# Patient Record
Sex: Female | Born: 1990 | Race: White | Hispanic: No | Marital: Married | Smoking: Never smoker
Health system: Southern US, Community
[De-identification: ages and names within clinical notes are randomized; demographics above are authoritative.]

## PROBLEM LIST (undated history)

## (undated) DIAGNOSIS — R519 Headache, unspecified: Secondary | ICD-10-CM

## (undated) DIAGNOSIS — R51 Headache: Secondary | ICD-10-CM

## (undated) HISTORY — PX: WISDOM TOOTH EXTRACTION: SHX21

---

## 2016-01-26 ENCOUNTER — Telehealth: Payer: Self-pay | Admitting: *Deleted

## 2016-01-26 ENCOUNTER — Encounter: Payer: Self-pay | Admitting: *Deleted

## 2016-01-26 ENCOUNTER — Ambulatory Visit
Admission: RE | Admit: 2016-01-26 | Discharge: 2016-01-26 | Disposition: A | Discharge status: Home / Self Care | Payer: 59 | Source: Ambulatory Visit | Attending: Family Medicine | Admitting: Family Medicine

## 2016-01-26 ENCOUNTER — Ambulatory Visit
Admission: EM | Admit: 2016-01-26 | Discharge: 2016-01-26 | Disposition: A | Discharge status: Home / Self Care | Payer: 59 | Attending: Family Medicine | Admitting: Family Medicine

## 2016-01-26 DIAGNOSIS — R1011 Right upper quadrant pain: Secondary | ICD-10-CM | POA: Diagnosis not present

## 2016-01-26 DIAGNOSIS — K8020 Calculus of gallbladder without cholecystitis without obstruction: Secondary | ICD-10-CM | POA: Diagnosis not present

## 2016-01-26 DIAGNOSIS — R932 Abnormal findings on diagnostic imaging of liver and biliary tract: Secondary | ICD-10-CM | POA: Insufficient documentation

## 2016-01-26 DIAGNOSIS — R112 Nausea with vomiting, unspecified: Secondary | ICD-10-CM

## 2016-01-26 DIAGNOSIS — Z711 Person with feared health complaint in whom no diagnosis is made: Secondary | ICD-10-CM

## 2016-01-26 LAB — COMPREHENSIVE METABOLIC PANEL
ALT: 23 U/L (ref 14–54)
ANION GAP: 9 (ref 5–15)
AST: 19 U/L (ref 15–41)
Albumin: 4.8 g/dL (ref 3.5–5.0)
Alkaline Phosphatase: 77 U/L (ref 38–126)
BILIRUBIN TOTAL: 0.5 mg/dL (ref 0.3–1.2)
BUN: 14 mg/dL (ref 6–20)
CALCIUM: 9.6 mg/dL (ref 8.9–10.3)
CO2: 24 mmol/L (ref 22–32)
Chloride: 105 mmol/L (ref 101–111)
Creatinine, Ser: 0.69 mg/dL (ref 0.44–1.00)
GLUCOSE: 108 mg/dL — AB (ref 65–99)
Potassium: 4.2 mmol/L (ref 3.5–5.1)
Sodium: 138 mmol/L (ref 135–145)
TOTAL PROTEIN: 8.4 g/dL — AB (ref 6.5–8.1)

## 2016-01-26 LAB — CBC WITH DIFFERENTIAL/PLATELET
Basophils Absolute: 0 10*3/uL (ref 0–0.1)
Basophils Relative: 0 %
Eosinophils Absolute: 0.1 10*3/uL (ref 0–0.7)
Eosinophils Relative: 1 %
HEMATOCRIT: 43.4 % (ref 35.0–47.0)
HEMOGLOBIN: 14.9 g/dL (ref 12.0–16.0)
LYMPHS ABS: 1.7 10*3/uL (ref 1.0–3.6)
Lymphocytes Relative: 18 %
MCH: 28.4 pg (ref 26.0–34.0)
MCHC: 34.3 g/dL (ref 32.0–36.0)
MCV: 82.8 fL (ref 80.0–100.0)
Monocytes Absolute: 0.4 10*3/uL (ref 0.2–0.9)
Monocytes Relative: 4 %
NEUTROS ABS: 6.9 10*3/uL — AB (ref 1.4–6.5)
NEUTROS PCT: 77 %
Platelets: 309 10*3/uL (ref 150–440)
RBC: 5.24 MIL/uL — ABNORMAL HIGH (ref 3.80–5.20)
RDW: 12.9 % (ref 11.5–14.5)
WBC: 9 10*3/uL (ref 3.6–11.0)

## 2016-01-26 LAB — URINALYSIS, COMPLETE (UACMP) WITH MICROSCOPIC
Bilirubin Urine: NEGATIVE
Glucose, UA: NEGATIVE mg/dL
KETONES UR: NEGATIVE mg/dL
LEUKOCYTES UA: NEGATIVE
NITRITE: NEGATIVE
PH: 5.5 (ref 5.0–8.0)
Protein, ur: NEGATIVE mg/dL

## 2016-01-26 LAB — PREGNANCY, URINE: Preg Test, Ur: NEGATIVE

## 2016-01-26 LAB — LIPASE, BLOOD: Lipase: 28 U/L (ref 11–51)

## 2016-01-26 LAB — AMYLASE: Amylase: 63 U/L (ref 28–100)

## 2016-01-26 MED ORDER — SUCRALFATE 1 G PO TABS: 2.0000 g | ORAL_TABLET | Freq: Two times a day (BID) | ORAL | 0 refills | Status: DC

## 2016-01-26 MED ORDER — ONDANSETRON 8 MG PO TBDP
8.0000 mg | ORAL_TABLET | Freq: Once | ORAL | Status: AC
  Administered 2016-01-26: 8 mg via ORAL

## 2016-01-26 MED ORDER — ONDANSETRON 8 MG PO TBDP: 8.0000 mg | ORAL_TABLET | Freq: Three times a day (TID) | ORAL | 0 refills | Status: DC | PRN

## 2016-01-26 NOTE — ED Provider Notes (Signed)
[facility]    CSN: [id number] Arrival date & time: 01/26/16  [id number]     History   Chief Complaint Chief Complaint  Patient presents with  . Abdominal Pain  . Emesis  . Nausea    HPI Chelsea Richardson is a 26 y.o. female.   Patient is a 26 year old white female with abdominal pain. She states that it woke her up about 3:30 this morning about 5:00 the pain seemed to move up to up her right quadrant. Initial presentation she is told the staff that she was having right lower quadrant pain is started more in the mid beta has since moved to the right upper quadrant. She reports she started throwing up soon after that and threw up several times. She did not eat anything since last night which she had a taco casserole. She reports feeling no appetite this morning because of the recurrent nausea. No fever or chills. She reports some urinary frequency but states that she drank a lot of water when she first woke up the pain. No previous abdominal surgeries no current medical problems. She is a gravida 1 para 0 she states last list. Was about 3 months ago. She states that she is not using any type of birth control and she gets pregnant that happens she's not tried to prevent pregnancy either and she is sexually active. Other than wisdom teeth extraction she's had no surgeries. She denied any chronic medications no known drug allergies. She does not smoke to stop tobacco. She states that there is no history of gallbladder disease in the family and mother corroborates that as well.   The history is provided by the patient and a parent. No language interpreter was used.  Abdominal Pain  Pain location:  RUQ Pain quality: cramping   Pain radiates to:  Does not radiate Pain severity:  Moderate Onset quality:  Sudden Duration:  9 hours Timing:  Constant Progression:  Waxing and waning Chronicity:  New Context: awakening from sleep   Context: not diet changes, not eating, not laxative use,  not medication withdrawal, not previous surgeries, not recent illness, not recent travel, not sick contacts and not suspicious food intake   Relieved by:  Nothing Worsened by:  Nothing Ineffective treatments:  None tried Associated symptoms: nausea and vomiting   Emesis  Associated symptoms: abdominal pain   Associated symptoms: no myalgias     History reviewed. No pertinent past medical history.  There are no active problems to display for this patient.   History reviewed. No pertinent surgical history.  OB History    No data available       Home Medications    Prior to Admission medications   Medication Sig Start Date End Date Taking? Authorizing Provider  ondansetron (ZOFRAN ODT) 8 MG disintegrating tablet Take 1 tablet (8 mg total) by mouth every 8 (eight) hours as needed for nausea or vomiting. 01/26/16   Hassan RowanEugene Anely Spiewak, MD  sucralfate (CARAFATE) 1 g tablet Take 2 tablets (2 g total) by mouth 2 (two) times daily. An hour before eating. 01/26/16   Hassan RowanEugene Nalea Salce, MD    Family History History reviewed. No pertinent family history.  Social History Social History  Substance Use Topics  . Smoking status: Never Smoker  . Smokeless tobacco: Never Used  . Alcohol use No     Allergies   Patient has no known allergies.   Review of Systems Review of Systems  Gastrointestinal: Positive for abdominal pain, nausea and vomiting.  Musculoskeletal: Negative for myalgias.  All other systems reviewed and are negative.    Physical Exam Triage Vital Signs ED Triage Vitals  Enc Vitals Group     BP 01/26/16 0841 125/80     Pulse Rate 01/26/16 0841 87     Resp 01/26/16 0841 16     Temp 01/26/16 0841 98.1 F (36.7 C)     Temp Source 01/26/16 0841 Oral     SpO2 01/26/16 0841 98 %     Weight 01/26/16 0843 200 lb (90.7 kg)     Height 01/26/16 0843 5\' 2"  (1.575 m)     Head Circumference --      Peak Flow --      Pain Score 01/26/16 0846 6     Pain Loc --      Pain Edu? --       Excl. in GC? --    No data found.   Updated Vital Signs BP 125/80 (BP Location: Left Arm)   Pulse 87   Temp 98.1 F (36.7 C) (Oral)   Resp 16   Ht 5\' 2"  (1.575 m)   Wt 200 lb (90.7 kg)   LMP 10/26/2015 (Approximate)   SpO2 98%   BMI 36.58 kg/m   Visual Acuity Right Eye Distance:   Left Eye Distance:   Bilateral Distance:    Right Eye Near:   Left Eye Near:    Bilateral Near:     Physical Exam  Constitutional: She is oriented to person, place, and time. She appears well-developed. No distress.  HENT:  Head: Normocephalic and atraumatic.  Right Ear: External ear normal.  Left Ear: External ear normal.  Eyes: EOM are normal. Pupils are equal, round, and reactive to light.  Neck: Normal range of motion. Neck supple.  Cardiovascular: Normal rate, regular rhythm and normal heart sounds.   Pulmonary/Chest: Effort normal and breath sounds normal.  Abdominal: Soft. Bowel sounds are normal. She exhibits distension. There is no hepatosplenomegaly. There is tenderness in the right upper quadrant. There is no CVA tenderness. No hernia.    Musculoskeletal: Normal range of motion.  Neurological: She is alert and oriented to person, place, and time.  Skin: Skin is warm. She is not diaphoretic.  Psychiatric: She has a normal mood and affect.  Vitals reviewed.    UC Treatments / Results  Labs (all labs ordered are listed, but only abnormal results are displayed) Labs Reviewed  URINALYSIS, COMPLETE (UACMP) WITH MICROSCOPIC - Abnormal; Notable for the following:       Result Value   Specific Gravity, Urine <1.005 (*)    Hgb urine dipstick SMALL (*)    Squamous Epithelial / LPF 0-5 (*)    Bacteria, UA RARE (*)    All other components within normal limits  CBC WITH DIFFERENTIAL/PLATELET - Abnormal; Notable for the following:    RBC 5.24 (*)    Neutro Abs 6.9 (*)    All other components within normal limits  COMPREHENSIVE METABOLIC PANEL - Abnormal; Notable for the following:     Glucose, Bld 108 (*)    Total Protein 8.4 (*)    All other components within normal limits  URINE CULTURE  PREGNANCY, URINE  AMYLASE  LIPASE, BLOOD    EKG  EKG Interpretation None       Radiology No results found.  Procedures Procedures (including critical care time)  Medications Ordered in UC Medications  ondansetron (ZOFRAN-ODT) disintegrating tablet 8 mg (8 mg Oral Given 01/26/16 0927)  Initial Impression / Assessment and Plan / UC Course  I have reviewed the triage vital signs and the nursing notes.  Pertinent labs & imaging results that were available during my care of the patient were reviewed by me and considered in my medical decision making (see chart for details).   Results for orders placed or performed during the hospital encounter of 01/26/16  Urinalysis, Complete w Microscopic  Result Value Ref Range   Color, Urine YELLOW YELLOW   APPearance CLEAR CLEAR   Specific Gravity, Urine <1.005 (L) 1.005 - 1.030   pH 5.5 5.0 - 8.0   Glucose, UA NEGATIVE NEGATIVE mg/dL   Hgb urine dipstick SMALL (A) NEGATIVE   Bilirubin Urine NEGATIVE NEGATIVE   Ketones, ur NEGATIVE NEGATIVE mg/dL   Protein, ur NEGATIVE NEGATIVE mg/dL   Nitrite NEGATIVE NEGATIVE   Leukocytes, UA NEGATIVE NEGATIVE   Squamous Epithelial / LPF 0-5 (A) NONE SEEN   WBC, UA 0-5 0 - 5 WBC/hpf   RBC / HPF 0-5 0 - 5 RBC/hpf   Bacteria, UA RARE (A) NONE SEEN  Pregnancy, urine  Result Value Ref Range   Preg Test, Ur NEGATIVE NEGATIVE  Amylase  Result Value Ref Range   Amylase 63 28 - 100 U/L  Lipase, blood  Result Value Ref Range   Lipase 28 11 - 51 U/L  CBC with Differential  Result Value Ref Range   WBC 9.0 3.6 - 11.0 K/uL   RBC 5.24 (H) 3.80 - 5.20 MIL/uL   Hemoglobin 14.9 12.0 - 16.0 g/dL   HCT 16.1 09.6 - 04.5 %   MCV 82.8 80.0 - 100.0 fL   MCH 28.4 26.0 - 34.0 pg   MCHC 34.3 32.0 - 36.0 g/dL   RDW 40.9 81.1 - 91.4 %   Platelets 309 150 - 440 K/uL   Neutrophils Relative % 77  %   Neutro Abs 6.9 (H) 1.4 - 6.5 K/uL   Lymphocytes Relative 18 %   Lymphs Abs 1.7 1.0 - 3.6 K/uL   Monocytes Relative 4 %   Monocytes Absolute 0.4 0.2 - 0.9 K/uL   Eosinophils Relative 1 %   Eosinophils Absolute 0.1 0 - 0.7 K/uL   Basophils Relative 0 %   Basophils Absolute 0.0 0 - 0.1 K/uL  Comprehensive metabolic panel  Result Value Ref Range   Sodium 138 135 - 145 mmol/L   Potassium 4.2 3.5 - 5.1 mmol/L   Chloride 105 101 - 111 mmol/L   CO2 24 22 - 32 mmol/L   Glucose, Bld 108 (H) 65 - 99 mg/dL   BUN 14 6 - 20 mg/dL   Creatinine, Ser 7.82 0.44 - 1.00 mg/dL   Calcium 9.6 8.9 - 95.6 mg/dL   Total Protein 8.4 (H) 6.5 - 8.1 g/dL   Albumin 4.8 3.5 - 5.0 g/dL   AST 19 15 - 41 U/L   ALT 23 14 - 54 U/L   Alkaline Phosphatase 77 38 - 126 U/L   Total Bilirubin 0.5 0.3 - 1.2 mg/dL   GFR calc non Af Amer >60 >60 mL/min   GFR calc Af Amer >60 >60 mL/min   Anion gap 9 5 - 15   Clinical Course       Patient had improvement with Zofran for nausea we'll place her on Zofran to go home and also Carafate 2 tablets twice a day an hour before eating. Should be noted lab work were reviewed urinalysis was unremarkable white count is normal amylase and  lipase was normal and CMP was essentially normal as well of the blood sugar I think of 109. Because the marked tenderness in the right upper quadrant I've offered to order an ultrasound of the gallbladder which they have elected to do. We'll place on Carafate 2 tablets twice a day and will order ultrasound with her gallstones. Present and a work note for today and tomorrow. Expect to her and her mother that if she continues to have pain i and her ultrasound is negative she needs to see her PCP for further testing for the evaluation. Explained to him not eating anything until the ultrasound was done.    Final Clinical Impressions(s) / UC Diagnoses   Final diagnoses:  RUQ abdominal pain  Non-intractable vomiting with nausea, unspecified vomiting  type  Concern about gallstones without diagnosis    New Prescriptions Discharge Medication List as of 01/26/2016 10:17 AM    START taking these medications   Details  ondansetron (ZOFRAN ODT) 8 MG disintegrating tablet Take 1 tablet (8 mg total) by mouth every 8 (eight) hours as needed for nausea or vomiting., Starting Wed 01/26/2016, Normal    sucralfate (CARAFATE) 1 g tablet Take 2 tablets (2 g total) by mouth 2 (two) times daily. An hour before eating., Starting Wed 01/26/2016, Normal        Note: This dictation was prepared with Dragon dictation along with smaller phrase technology. Any transcriptional errors that result from this process are unintentional.   Hassan Rowan, MD 01/26/16 1046

## 2016-01-26 NOTE — ED Notes (Signed)
Per Dr Thurmond ButtsWade, [facility] she requested first available appointment. I contacted [facility] and spoke with Whitney with pt info, they will contact pt to arrange appointment.

## 2016-01-26 NOTE — ED Triage Notes (Signed)
Pt awoke with RLQ abd pain this morning approx 0300, also N/V x2.

## 2016-01-27 ENCOUNTER — Encounter: Payer: Self-pay | Admitting: Surgery

## 2016-01-27 ENCOUNTER — Ambulatory Visit (INDEPENDENT_AMBULATORY_CARE_PROVIDER_SITE_OTHER): Payer: 59 | Admitting: Surgery

## 2016-01-27 VITALS — BP 101/67 | HR 69 | Temp 97.9°F | Ht 62.0 in | Wt 219.0 lb

## 2016-01-27 DIAGNOSIS — K8020 Calculus of gallbladder without cholecystitis without obstruction: Secondary | ICD-10-CM | POA: Diagnosis not present

## 2016-01-27 NOTE — Patient Instructions (Addendum)
Please give [place] a call if you have any questions or concerns.   Low-Fat Diet for Gallbladder Conditions A low-fat diet can be helpful if you have pancreatitis or a gallbladder condition. With these conditions, your pancreas and gallbladder have trouble digesting fats. A healthy eating plan with less fat will help rest your pancreas and gallbladder and reduce your symptoms. What do I need to know about this diet?  Eat a low-fat diet.  Reduce your fat intake to less than 20-30% of your total daily calories. This is less than 50-60 g of fat per day.  Remember that you need some fat in your diet. Ask your dietician what your daily goal should be.  Choose nonfat and low-fat healthy foods. Look for the words "nonfat," "low fat," or "fat free."  As a guide, look on the label and choose foods with less than 3 g of fat per serving. Eat only one serving.  Avoid alcohol.  Do not smoke. If you need help quitting, talk with your health care provider.  Eat small frequent meals instead of three large heavy meals. What foods can I eat? Grains  Include healthy grains and starches such as potatoes, wheat bread, fiber-rich cereal, and brown rice. Choose whole grain options whenever possible. In adults, whole grains should account for 45-65% of your daily calories. Fruits and Vegetables  Eat plenty of fruits and vegetables. Fresh fruits and vegetables add fiber to your diet. Meats and Other Protein Sources  Eat lean meat such as chicken and pork. Trim any fat off of meat before cooking it. Eggs, fish, and beans are other sources of protein. In adults, these foods should account for 10-35% of your daily calories. Dairy  Choose low-fat milk and dairy options. Dairy includes fat and protein, as well as calcium. Fats and Oils  Limit high-fat foods such as fried foods, sweets, baked goods, sugary drinks. Other  Creamy sauces and condiments, such as mayonnaise, can add extra fat. Think about whether or not  you need to use them, or use smaller amounts or low fat options. What foods are not recommended?  High fat foods, such as:  [employer].  Ice cream.  [place] toast.  Sweet rolls.  Pizza.  Cheese bread.  Foods covered with batter, butter, creamy sauces, or cheese.  Fried foods.  Sugary drinks and desserts.  Foods that cause gas or bloating This information is not intended to replace advice given to you by your health care provider. Make sure you discuss any questions you have with your health care provider. Document Released: 01/07/2013 Document Revised: 06/10/2015 Document Reviewed: 12/16/2012 Elsevier Interactive Patient Education  2017 [employer]    Laparoscopic Cholecystectomy Laparoscopic cholecystectomy is surgery to remove the gallbladder. The gallbladder is a pear-shaped organ that lies beneath the liver on the right side of the body. The gallbladder stores bile, which is a fluid that helps the body to digest fats. Cholecystectomy is often done for inflammation of the gallbladder (cholecystitis). This condition is usually caused by a buildup of gallstones (cholelithiasis) in the gallbladder. Gallstones can block the flow of bile, which can result in inflammation and pain. In severe cases, emergency surgery may be required. This procedure is done though small incisions in your abdomen (laparoscopic surgery). A thin scope with a camera (laparoscope) is inserted through one incision. Thin surgical instruments are inserted through the other incisions. In some cases, a laparoscopic procedure may be turned into a type of surgery that is done through a  larger incision (open surgery). Tell a health care provider about:  Any allergies you have.  All medicines you are taking, including vitamins, herbs, eye drops, creams, and over-the-counter medicines.  Any problems you or family members have had with anesthetic medicines.  Any blood disorders you have.  Any surgeries you  have had.  Any medical conditions you have.  Whether you are pregnant or may be pregnant. What are the risks? Generally, this is a safe procedure. However, problems may occur, including:  Infection.  Bleeding.  Allergic reactions to medicines.  Damage to other structures or organs.  A stone remaining in the common bile duct. The common bile duct carries bile from the gallbladder into the small intestine.  A bile leak from the cyst duct that is clipped when your gallbladder is removed. What happens before the procedure? Staying hydrated  Follow instructions from your health care provider about hydration, which may include:  Up to 2 hours before the procedure - you may continue to drink clear liquids, such as water, clear fruit juice, black coffee, and plain tea. Eating and drinking restrictions  Follow instructions from your health care provider about eating and drinking, which may include:  8 hours before the procedure - stop eating heavy meals or foods such as meat, fried foods, or fatty foods.  6 hours before the procedure - stop eating light meals or foods, such as toast or cereal.  6 hours before the procedure - stop drinking milk or drinks that contain milk.  2 hours before the procedure - stop drinking clear liquids. Medicines  Ask your health care provider about:  Changing or stopping your regular medicines. This is especially important if you are taking diabetes medicines or blood thinners.  Taking medicines such as aspirin and ibuprofen. These medicines can thin your blood. Do not take these medicines before your procedure if your health care provider instructs you not to.  You may be given antibiotic medicine to help prevent infection. General instructions  Let your health care provider know if you develop a cold or an infection before surgery.  Plan to have someone take you home from the hospital or clinic.  Ask your health care provider how your surgical  site will be marked or identified. What happens during the procedure?  To reduce your risk of infection:  Your health care team will wash or sanitize their hands.  Your skin will be washed with soap.  Hair may be removed from the surgical area.  An IV tube may be inserted into one of your veins.  You will be given one or more of the following:  A medicine to help you relax (sedative).  A medicine to make you fall asleep (general anesthetic).  A breathing tube will be placed in your mouth.  Your surgeon will make several small cuts (incisions) in your abdomen.  The laparoscope will be inserted through one of the small incisions. The camera on the laparoscope will send images to a TV screen (monitor) in the operating room. This lets your surgeon see inside your abdomen.  Air-like gas will be pumped into your abdomen. This will expand your abdomen to give the surgeon more room to perform the surgery.  Other tools that are needed for the procedure will be inserted through the other incisions. The gallbladder will be removed through one of the incisions.  Your common bile duct may be examined. If stones are found in the common bile duct, they may be removed.  After your gallbladder has been removed, the incisions will be closed with stitches (sutures), staples, or skin glue.  Your incisions may be covered with a bandage (dressing). The procedure may vary among health care providers and hospitals. What happens after the procedure?  Your blood pressure, heart rate, breathing rate, and blood oxygen level will be monitored until the medicines you were given have worn off.  You will be given medicines as needed to control your pain.  Do not drive for 24 hours if you were given a sedative. This information is not intended to replace advice given to you by your health care provider. Make sure you discuss any questions you have with your health care provider. Document Released:  01/02/2005 Document Revised: 07/25/2015 Document Reviewed: 06/21/2015 Elsevier Interactive Patient Education  2017 [employer].

## 2016-01-27 NOTE — Progress Notes (Signed)
01/27/2016  Reason for Visit:  Cholelithiasis  History of Present Illness: Chelsea Richardson is a 26 y.o. female who presented to urgent care yesterday at 3 AM with an episode of biliary colic. She had had a tackle casserole for dinner the night prior. She reports she was having episodes of nausea and vomiting 3 as well as right upper quadrant pain. When she went to urgent care facility she was given medication for nausea and her pain subsided on its own. Her workup at the urgent care facility included labs and ultrasound. Her LFTs were normal and her white blood cell count was normal. Her ultrasound showed cholelithiasis with the largest stone measuring 1.3 cm but otherwise no gallbladder wall thickening or pericholecystic fluid.  She reports that her pain subsided while she was in the emergency room and she only had some mild lingering discomfort in the right upper quadrant. Today she denies having any significant pain. She also denies having any fevers, chills, chest pain, shortness of breath, nausea, vomiting, any other abdominal pain.  Of note, this is her first episode of biliary colic and has never had any similar pain in the past.  Past Medical History: No past medical history on file.   Past Surgical History: Past Surgical History:  Procedure Laterality Date  . WISDOM TOOTH EXTRACTION      Home Medications: Prior to Admission medications   Medication Sig Start Date End Date Taking? Authorizing Provider  ondansetron (ZOFRAN ODT) 8 MG disintegrating tablet Take 1 tablet (8 mg total) by mouth every 8 (eight) hours as needed for nausea or vomiting. 01/26/16  Yes Hassan RowanEugene Wade, MD  sucralfate (CARAFATE) 1 g tablet Take 2 tablets (2 g total) by mouth 2 (two) times daily. An hour before eating. 01/26/16  Yes Hassan RowanEugene Wade, MD    Allergies: No Known Allergies  Social History:  reports that she has never smoked. She has never used smokeless tobacco. She reports that she does not drink alcohol  or use drugs.   Family History: Family History  Problem Relation Age of Onset  . Healthy Mother   . Healthy Father   . Cancer Maternal Grandmother [phone]    breast    Review of Systems: Review of Systems  Constitutional: Negative for chills and fever.  HENT: Negative for hearing loss.   Eyes: Negative for blurred vision.  Respiratory: Negative for cough and shortness of breath.   Cardiovascular: Negative for chest pain and leg swelling.  Gastrointestinal: Positive for abdominal pain, nausea and vomiting. Negative for constipation, diarrhea and heartburn.  Genitourinary: Negative for dysuria and hematuria.  Musculoskeletal: Negative for myalgias.  Neurological: Negative for dizziness.  Psychiatric/Behavioral: Negative for depression.  All other systems reviewed and are negative.   Physical Exam BP 101/67   Pulse 69   Temp 97.9 F (36.6 C) (Oral)   Ht 5\' 2"  (1.575 m)   Wt 99.3 kg (219 lb)   LMP 10/26/2015 (Approximate)   BMI 40.06 kg/m  CONSTITUTIONAL: No acute distress, appears comfortable. HEENT:  Normocephalic, atraumatic, extraocular motion intact. NECK: Trachea is midline, and there is no jugular venous distension.  RESPIRATORY:  Lungs are clear, and breath sounds are equal bilaterally. Normal respiratory effort without pathologic use of accessory muscles. CARDIOVASCULAR: Heart is regular without murmurs, gallops, or rubs. GI: The abdomen is soft, nondistended, obese, with minimal discomfort over the right upper quadrant. Negative Murphy's sign. There were no palpable masses.  MUSCULOSKELETAL:  Normal muscle strength and tone in all four extremities.  No peripheral edema or cyanosis. SKIN: Skin turgor is normal. There are no pathologic skin lesions.  NEUROLOGIC:  Motor and sensation is grossly normal.  Cranial nerves are grossly intact. PSYCH:  Alert and oriented to person, place and time. Affect is normal.  Laboratory Analysis: No results found for this or any previous  visit (from the past 24 hour(s)).  Imaging: US Abdomen Limited Ruq  Result Date: 01/26/2016 CLINICAL DATA:  9 hours of right upper quadrant abdominal pain associated with nausea and vomiting. EXAM: US ABDOMEN LIMITED - RIGHT UPPER QUADRANT COMPARISON:  None in PACs FINDINGS: Gallbladder: The gallbladder is adequately distended. There are stones present with the largest measuring 1.3 cm. There is no gallbladder wall thickening, pericholecystic fluid, or positive sonographic Murphy's sign. Common bile duct: Diameter: 3.2 mm Liver: The hepatic echotexture is mildly increased diffusely. There is no focal mass nor ductal dilation. IMPRESSION: Gallstones without sonographic evidence of acute cholecystitis. Increased hepatic echotexture likely reflects fatty infiltrative change. These results will be called to the ordering clinician or representative by the Radiologist Assistant, and communication documented in the PACS or zVision Dashboard. Electronically Signed   By: David  [place] M.D.   On: 01/26/2016 12:19    Assessment and Plan: This is a 26 y.o. female who presents with her first episode of biliary colic. I personally reviewed all of the patient's laboratory studies and imaging studies.  -Had a lengthy discussion with the patient that currently there is no urgency or emergency to have her gallbladder removed. There is no evidence of cholecystitis and all of her labs were normal yesterday and her pain has subsided appropriately.  -Discussed with the patient the options of watchful waiting with some dietary modifications like low-fat diet to decrease any risks of further biliary colic episodes, did inform the patient that even with these changes there is no guarantee that she would not have any further episodes and if she does it would be difficult to say when they would happen. The patient does understand this but she would rather not have any surgery unless is actually needed.  -With that in mind I did  discuss with the patient any further signs or symptoms to look out for including further nausea, vomiting, worsening right upper quadrant pain that does not subside on its own, jaundice of the skin or scleral icterus, and fevers or chills. If any of these were to happen she should return to urgent care or emergency room for further evaluation. I also discussed with the patient that if she were to change her mind and seek surgical management for her biliary colic that we would be happy to see her in the future.   Howie Ill, MD [facility]

## 2016-01-28 LAB — URINE CULTURE: SPECIAL REQUESTS: NORMAL

## 2016-06-01 ENCOUNTER — Ambulatory Visit (INDEPENDENT_AMBULATORY_CARE_PROVIDER_SITE_OTHER): Payer: 59 | Admitting: Surgery

## 2016-06-01 ENCOUNTER — Encounter: Payer: Self-pay | Admitting: Surgery

## 2016-06-01 VITALS — BP 138/86 | HR 99 | Temp 98.1°F | Ht 62.0 in | Wt 200.0 lb

## 2016-06-01 DIAGNOSIS — K8020 Calculus of gallbladder without cholecystitis without obstruction: Secondary | ICD-10-CM | POA: Diagnosis not present

## 2016-06-01 NOTE — Progress Notes (Signed)
Surgical Consultation  06/01/2016  Chelsea Richardson is an 26 y.o. female.   Chief Complaint  Patient presents with  . Other    To disscuss gallstones & surgery-Previosly seen by Piscoya on 01/27/16     HPI: Chelsea Richardson is a 26 year old female well known to our service with a prior history of symptomatic cholelithiasis. She was seen back in January by Dr. Aleen Campi at that time she was very hesitant about having any surgical intervention. Now she comes back with a recurrent episode that is started 2 days ago with abdominal pain nausea and vomiting. Now her symptoms are better but she still has some sacral tenderness in the right upper quadrant. States that she has intermittent and sharp pain in the right upper quadrant sometimes severe and last for a few minutes. She has acid nausea and vomiting. No evidence of jaundice or cholangitis. She is taking some by mouth but has decreased appetite. Previous ultrasound personally reviewed there is evidence of cholelithiasis. Normal common bile duct and normal LFTs.  She is able to perform more than 4 Mets of activity without any shortness of breath or chest pain and no previous abdominal operations  History reviewed. No pertinent past medical history.  Past Surgical History:  Procedure Laterality Date  . WISDOM TOOTH EXTRACTION      Family History  Problem Relation Age of Onset  . Healthy Mother   . Healthy Father   . Cancer Maternal Grandmother 28       breast    Social History:  reports that she has never smoked. She has never used smokeless tobacco. She reports that she does not drink alcohol or use drugs.  Allergies: No Known Allergies  Medications reviewed.    ROS Full ROS performed and is otherwise negative other than what is stated in the HPI    BP 138/86   Pulse 99   Temp 98.1 F (36.7 C) (Oral)   Ht 5\' 2"  (1.575 m)   Wt 90.7 kg (200 lb)   BMI 36.58 kg/m   Physical Exam  Constitutional: She is oriented to person, place,  and time and well-developed, well-nourished, and in no distress. No distress.  HENT:  Head: Normocephalic.  Eyes: Right eye exhibits no discharge. Left eye exhibits no discharge. No scleral icterus.  Neck: Neck supple. JVD present. No tracheal deviation present. No thyromegaly present.  Cardiovascular: Normal rate and normal heart sounds.   No murmur heard. Pulmonary/Chest: Effort normal and breath sounds normal. No respiratory distress. She has no wheezes. She has no rales.  Abdominal: Soft. She exhibits no distension and no mass. There is tenderness. There is no rebound and no guarding.  RUQ tenderness, no peritonitis, neg murphy.  Musculoskeletal: Normal range of motion. She exhibits no edema.  Neurological: She is alert and oriented to person, place, and time. Gait normal. GCS score is 15.  Skin: Skin is warm and dry. She is not diaphoretic.  Psychiatric: Mood, memory, affect and judgment normal.  Nursing note and vitals reviewed.    Assessment/Plan: Symptomatic cholelithiasis with recurrent dramatic episodes. Discussed with the patient detail I strongly recommend laparoscopic cholecystectomy. Discussed with her and I do think that we need to do this sooner rather than later. I gave her the option of waiting 2 more weeks versus having Dr. Marcene Duos do her laparoscopic cholecystectomy in 3 days. She wishes to go ahead and have her gallbladder removed this coming Monday. I discussed the procedure in detail.  The patient was given  Agricultural engineereducational material.  We discussed the risks and benefits of a laparoscopic cholecystectomy and possible cholangiogram including, but not limited to bleeding, infection, injury to surrounding structures such as the intestine or liver, bile leak, retained gallstones, need to convert to an open procedure, prolonged diarrhea, blood clots such as  DVT, common bile duct injury, anesthesia risks, and possible need for additional procedures.  The likelihood of  improvement in symptoms and return to the patient's normal status is good. We discussed the typical post-operative recovery course.   Sterling Bigiego Yann Biehn, MD [facility] General Surgeon

## 2016-06-01 NOTE — Patient Instructions (Signed)
We have scheduled your surgery for the date listed below.   Please see your blue sheet for further instructions.

## 2016-06-02 ENCOUNTER — Telehealth: Payer: Self-pay | Admitting: Surgery

## 2016-06-02 ENCOUNTER — Encounter
Admission: RE | Admit: 2016-06-02 | Discharge: 2016-06-02 | Disposition: A | Discharge status: Home / Self Care | Payer: 59 | Source: Ambulatory Visit | Attending: Surgery | Admitting: Surgery

## 2016-06-02 HISTORY — DX: Headache: R51

## 2016-06-02 HISTORY — DX: Headache, unspecified: R51.9

## 2016-06-02 NOTE — Telephone Encounter (Signed)
Pt advised of pre op date/time and sx date. Sx: 06/05/16 with Dr Dellis FilbertPiscoya--Laparoscopic cholecystectomy.  Pre op: 06/02/16 (phone) between 9-1:00pm.  Patient made aware to call [phone], between 1-3:00pm the day before surgery, to find out what time to arrive.     Patient has agreed to make a payment for surgery for physician charges.   Deductible: 550.00 Co insurance: 15% Physician estimate: [phone]

## 2016-06-02 NOTE — Telephone Encounter (Signed)
CPT: [id number] has been approved for DOS: 06/05/16 with Dr Aleen CampiPiscoya.   [employer] approved online: Authorization number: [id number]

## 2016-06-02 NOTE — Patient Instructions (Signed)
  Your procedure is scheduled on: 06-05-16  Report to Same Day Surgery 2nd floor medical mall [facility](Medical Mall Entrance-take elevator on left to 2nd floor.  Check in with surgery information desk.) To find out your arrival time please call [phone] between 1PM - 3PM on 06-04-16  Remember: Instructions that are not followed completely may result in serious medical risk, up to and including death, or upon the discretion of your surgeon and anesthesiologist your surgery may need to be rescheduled.    _x___ 1. Do not eat food or drink liquids after midnight. No gum chewing or hard candies.     __x__ 2. No Alcohol for 24 hours before or after surgery.   __x__3. No Smoking for 24 prior to surgery.   ____  4. Bring all medications with you on the day of surgery if instructed.    __x__ 5. Notify your doctor if there is any change in your medical condition     (cold, fever, infections).     Do not wear jewelry, make-up, hairpins, clips or nail polish.  Do not wear lotions, powders, or perfumes. You may wear deodorant.  Do not shave 48 hours prior to surgery. Men may shave face and neck.  Do not bring valuables to the hospital.    [facility] any belongings or valuables.               Contacts, dentures or bridgework may not be worn into surgery.  Leave your suitcase in the car. After surgery it may be brought to your room.  For patients admitted to the hospital, discharge time is determined by your treatment team.   Patients discharged the day of surgery will not be allowed to drive home.  You will need someone to drive you home and stay with you the night of your procedure.    Please read over the following fact sheets that you were given:     ____ Take anti-hypertensive (unless it includes a diuretic), cardiac, seizure, asthma,     anti-reflux and psychiatric medicines. These include:  1. NONE  2.  3.  4.  5.  6.  ____Fleets enema or Magnesium Citrate as  directed.   _x___ Use CHG Soap or sage wipes as directed on instruction sheet   ____ Use inhalers on the day of surgery and bring to hospital day of surgery  ____ Stop Metformin and Janumet 2 days prior to surgery.    ____ Take 1/2 of usual insulin dose the night before surgery and none on the morning surgery.   ____ Follow recommendations from Cardiologist, Pulmonologist or PCP regarding stopping Aspirin, Coumadin, Pllavix ,Eliquis, Effient, or Pradaxa, and Pletal.  X____Stop Anti-inflammatories such as Advil, Aleve, IBUPROFEN, Motrin, Naproxen, Naprosyn, Goodies powders or aspirin products NOW0 OK to take Tylenol    ____ Stop supplements until after surgery   ____ Bring C-Pap to the hospital.

## 2016-06-04 MED ORDER — CEFAZOLIN SODIUM-DEXTROSE 2-4 GM/100ML-% IV SOLN
2.0000 g | INTRAVENOUS | Status: AC
  Administered 2016-06-05: 2 g via INTRAVENOUS

## 2016-06-05 ENCOUNTER — Observation Stay
Admission: RE | Admit: 2016-06-05 | Discharge: 2016-06-06 | Disposition: A | Discharge status: Home / Self Care | Payer: 59 | Source: Ambulatory Visit | Attending: Surgery | Admitting: Surgery

## 2016-06-05 ENCOUNTER — Encounter: Payer: Self-pay | Admitting: *Deleted

## 2016-06-05 ENCOUNTER — Ambulatory Visit: Payer: 59 | Admitting: *Deleted

## 2016-06-05 ENCOUNTER — Encounter
Admission: RE | Disposition: A | Discharge status: Home / Self Care | Payer: Self-pay | Source: Ambulatory Visit | Attending: Surgery

## 2016-06-05 DIAGNOSIS — K811 Chronic cholecystitis: Secondary | ICD-10-CM | POA: Diagnosis not present

## 2016-06-05 DIAGNOSIS — Z6838 Body mass index (BMI) 38.0-38.9, adult: Secondary | ICD-10-CM | POA: Insufficient documentation

## 2016-06-05 DIAGNOSIS — K8020 Calculus of gallbladder without cholecystitis without obstruction: Secondary | ICD-10-CM | POA: Diagnosis present

## 2016-06-05 DIAGNOSIS — E669 Obesity, unspecified: Secondary | ICD-10-CM | POA: Insufficient documentation

## 2016-06-05 DIAGNOSIS — K660 Peritoneal adhesions (postprocedural) (postinfection): Secondary | ICD-10-CM | POA: Insufficient documentation

## 2016-06-05 DIAGNOSIS — K8012 Calculus of gallbladder with acute and chronic cholecystitis without obstruction: Secondary | ICD-10-CM | POA: Diagnosis not present

## 2016-06-05 HISTORY — PX: CHOLECYSTECTOMY: SHX55

## 2016-06-05 LAB — CREATININE, SERUM
Creatinine, Ser: 0.73 mg/dL (ref 0.44–1.00)
GFR calc Af Amer: >60 mL/min (ref >60)
GFR calc non Af Amer: >60 mL/min (ref >60)

## 2016-06-05 LAB — POCT PREGNANCY, URINE: Preg Test, Ur: NEGATIVE

## 2016-06-05 SURGERY — LAPAROSCOPIC CHOLECYSTECTOMY
Anesthesia: General | Wound class: Clean Contaminated

## 2016-06-05 MED ORDER — ROCURONIUM BROMIDE 50 MG/5ML IV SOLN
INTRAVENOUS | Status: AC
  Filled 2016-06-05: qty 1

## 2016-06-05 MED ORDER — FENTANYL CITRATE (PF) 100 MCG/2ML IJ SOLN
INTRAMUSCULAR | Status: AC
  Filled 2016-06-05: qty 2

## 2016-06-05 MED ORDER — OXYCODONE HCL 5 MG PO TABS
5.0000 mg | ORAL_TABLET | ORAL | Status: DC | PRN
  Administered 2016-06-05: 10 mg via ORAL
  Administered 2016-06-05: 5 mg via ORAL
  Administered 2016-06-06 (×3): 10 mg via ORAL
  Filled 2016-06-05: qty 1
  Filled 2016-06-05 (×4): qty 2

## 2016-06-05 MED ORDER — FAMOTIDINE 20 MG PO TABS
ORAL_TABLET | ORAL | Status: AC
  Filled 2016-06-05: qty 1

## 2016-06-05 MED ORDER — HYDROMORPHONE HCL 1 MG/ML IJ SOLN: 0.5000 mg | INTRAMUSCULAR | Status: DC | PRN

## 2016-06-05 MED ORDER — DEXAMETHASONE SODIUM PHOSPHATE 10 MG/ML IJ SOLN
INTRAMUSCULAR | Status: DC | PRN
  Administered 2016-06-05: 10 mg via INTRAVENOUS

## 2016-06-05 MED ORDER — BUPIVACAINE-EPINEPHRINE (PF) 0.25% -1:200000 IJ SOLN
INTRAMUSCULAR | Status: AC
  Filled 2016-06-05: qty 30

## 2016-06-05 MED ORDER — POLYETHYLENE GLYCOL 3350 17 G PO PACK: 17.0000 g | PACK | Freq: Every day | ORAL | Status: DC | PRN

## 2016-06-05 MED ORDER — ONDANSETRON HCL 4 MG/2ML IJ SOLN
4.0000 mg | Freq: Once | INTRAMUSCULAR | Status: DC | PRN
Start: 2016-06-05 — End: 2016-06-05

## 2016-06-05 MED ORDER — MIDAZOLAM HCL 2 MG/2ML IJ SOLN
INTRAMUSCULAR | Status: AC
  Filled 2016-06-05: qty 2

## 2016-06-05 MED ORDER — ESMOLOL HCL 100 MG/10ML IV SOLN
INTRAVENOUS | Status: AC
  Filled 2016-06-05: qty 10

## 2016-06-05 MED ORDER — ONDANSETRON HCL 4 MG/2ML IJ SOLN
INTRAMUSCULAR | Status: DC | PRN
  Administered 2016-06-05: 4 mg via INTRAVENOUS

## 2016-06-05 MED ORDER — ONDANSETRON HCL 4 MG/2ML IJ SOLN: 4.0000 mg | Freq: Four times a day (QID) | INTRAMUSCULAR | Status: DC | PRN

## 2016-06-05 MED ORDER — EPHEDRINE SULFATE 50 MG/ML IJ SOLN
INTRAMUSCULAR | Status: DC | PRN
  Administered 2016-06-05: 10 mg via INTRAVENOUS

## 2016-06-05 MED ORDER — ACETAMINOPHEN 10 MG/ML IV SOLN
INTRAVENOUS | Status: DC | PRN
  Administered 2016-06-05: 1000 mg via INTRAVENOUS

## 2016-06-05 MED ORDER — EPHEDRINE SULFATE 50 MG/ML IJ SOLN
INTRAMUSCULAR | Status: AC
  Filled 2016-06-05: qty 1

## 2016-06-05 MED ORDER — BUPIVACAINE-EPINEPHRINE (PF) 0.25% -1:200000 IJ SOLN
INTRAMUSCULAR | Status: DC | PRN
  Administered 2016-06-05: 20 mL via PERINEURAL

## 2016-06-05 MED ORDER — FENTANYL CITRATE (PF) 100 MCG/2ML IJ SOLN
INTRAMUSCULAR | Status: DC | PRN
  Administered 2016-06-05 (×3): 100 ug via INTRAVENOUS

## 2016-06-05 MED ORDER — ROCURONIUM BROMIDE 100 MG/10ML IV SOLN
INTRAVENOUS | Status: DC | PRN
  Administered 2016-06-05: 10 mg via INTRAVENOUS
  Administered 2016-06-05: 40 mg via INTRAVENOUS
  Administered 2016-06-05: 20 mg via INTRAVENOUS

## 2016-06-05 MED ORDER — MIDAZOLAM HCL 2 MG/2ML IJ SOLN
INTRAMUSCULAR | Status: DC | PRN
  Administered 2016-06-05: 2 mg via INTRAVENOUS

## 2016-06-05 MED ORDER — PROPOFOL 10 MG/ML IV BOLUS
INTRAVENOUS | Status: DC | PRN
  Administered 2016-06-05: 150 mg via INTRAVENOUS

## 2016-06-05 MED ORDER — LIDOCAINE HCL (PF) 2 % IJ SOLN
INTRAMUSCULAR | Status: AC
  Filled 2016-06-05: qty 2

## 2016-06-05 MED ORDER — SUCCINYLCHOLINE CHLORIDE 20 MG/ML IJ SOLN
INTRAMUSCULAR | Status: DC | PRN
  Administered 2016-06-05: 120 mg via INTRAVENOUS

## 2016-06-05 MED ORDER — SUCCINYLCHOLINE CHLORIDE 20 MG/ML IJ SOLN
INTRAMUSCULAR | Status: AC
  Filled 2016-06-05: qty 1

## 2016-06-05 MED ORDER — ACETAMINOPHEN 500 MG PO TABS
1000.0000 mg | ORAL_TABLET | Freq: Four times a day (QID) | ORAL | Status: DC
  Administered 2016-06-05 – 2016-06-06 (×4): 1000 mg via ORAL
  Filled 2016-06-05 (×4): qty 2

## 2016-06-05 MED ORDER — PANTOPRAZOLE SODIUM 40 MG IV SOLR
40.0000 mg | Freq: Every day | INTRAVENOUS | Status: DC
  Administered 2016-06-05: 40 mg via INTRAVENOUS
  Filled 2016-06-05: qty 40

## 2016-06-05 MED ORDER — PROPOFOL 10 MG/ML IV BOLUS
INTRAVENOUS | Status: AC
  Filled 2016-06-05: qty 40

## 2016-06-05 MED ORDER — FAMOTIDINE 20 MG PO TABS
20.0000 mg | ORAL_TABLET | Freq: Once | ORAL | Status: AC
  Administered 2016-06-05: 20 mg via ORAL

## 2016-06-05 MED ORDER — LACTATED RINGERS IV SOLN
INTRAVENOUS | Status: DC
  Administered 2016-06-05 (×2): via INTRAVENOUS

## 2016-06-05 MED ORDER — LACTATED RINGERS IV SOLN
125.0000 mL/h | INTRAVENOUS | Status: DC
  Administered 2016-06-05 – 2016-06-06 (×3): 125 mL/h via INTRAVENOUS

## 2016-06-05 MED ORDER — CHLORHEXIDINE GLUCONATE CLOTH 2 % EX PADS: 6.0000 | MEDICATED_PAD | Freq: Once | CUTANEOUS | Status: DC

## 2016-06-05 MED ORDER — SUGAMMADEX SODIUM 200 MG/2ML IV SOLN
INTRAVENOUS | Status: AC
  Filled 2016-06-05: qty 2

## 2016-06-05 MED ORDER — SUGAMMADEX SODIUM 200 MG/2ML IV SOLN
INTRAVENOUS | Status: DC | PRN
  Administered 2016-06-05: 200 mg via INTRAVENOUS

## 2016-06-05 MED ORDER — CEFAZOLIN SODIUM-DEXTROSE 2-4 GM/100ML-% IV SOLN
INTRAVENOUS | Status: AC
  Filled 2016-06-05: qty 100

## 2016-06-05 MED ORDER — ENOXAPARIN SODIUM 40 MG/0.4ML ~~LOC~~ SOLN: 40.0000 mg | SUBCUTANEOUS | Status: DC

## 2016-06-05 MED ORDER — ONDANSETRON 4 MG PO TBDP: 4.0000 mg | ORAL_TABLET | Freq: Four times a day (QID) | ORAL | Status: DC | PRN

## 2016-06-05 MED ORDER — FENTANYL CITRATE (PF) 100 MCG/2ML IJ SOLN
INTRAMUSCULAR | Status: AC
  Administered 2016-06-05: 25 ug via INTRAVENOUS
  Filled 2016-06-05: qty 2

## 2016-06-05 MED ORDER — ACETAMINOPHEN 10 MG/ML IV SOLN
INTRAVENOUS | Status: AC
  Filled 2016-06-05: qty 100

## 2016-06-05 MED ORDER — FENTANYL CITRATE (PF) 100 MCG/2ML IJ SOLN
25.0000 ug | INTRAMUSCULAR | Status: AC | PRN
  Administered 2016-06-05 (×6): 25 ug via INTRAVENOUS

## 2016-06-05 SURGICAL SUPPLY — 49 items
APPLIER CLIP 5 13 M/L LIGAMAX5 (MISCELLANEOUS) ×3
BLADE SURG 15 STRL LF DISP TIS (BLADE) ×1 IMPLANT
BLADE SURG 15 STRL SS (BLADE) ×2
BULB RESERV EVAC DRAIN JP 100C (MISCELLANEOUS) ×3 IMPLANT
CANISTER SUCT 1200ML W/VALVE (MISCELLANEOUS) ×3 IMPLANT
CATH CHOLANGI 4FR 420404F (CATHETERS) IMPLANT
CHLORAPREP W/TINT 26ML (MISCELLANEOUS) ×3 IMPLANT
CLIP APPLIE 5 13 M/L LIGAMAX5 (MISCELLANEOUS) ×1 IMPLANT
CONRAY 60ML FOR OR (MISCELLANEOUS) IMPLANT
DERMABOND ADVANCED (GAUZE/BANDAGES/DRESSINGS) ×2
DERMABOND ADVANCED .7 DNX12 (GAUZE/BANDAGES/DRESSINGS) ×1 IMPLANT
DRAIN CHANNEL JP 19F (MISCELLANEOUS) ×3 IMPLANT
DRAPE C-ARM XRAY 36X54 (DRAPES) IMPLANT
DRSG TEGADERM 4X4.75 (GAUZE/BANDAGES/DRESSINGS) ×3 IMPLANT
ELECT REM PT RETURN 9FT ADLT (ELECTROSURGICAL) ×3
ELECTRODE REM PT RTRN 9FT ADLT (ELECTROSURGICAL) ×1 IMPLANT
ENDOPOUCH RETRIEVER 10 (MISCELLANEOUS) ×3 IMPLANT
FILTER LAP SMOKE EVAC STRL (MISCELLANEOUS) IMPLANT
GAUZE SPONGE 4X4 12PLY STRL (GAUZE/BANDAGES/DRESSINGS) ×3 IMPLANT
GLOVE SURG SYN 7.0 (GLOVE) ×3 IMPLANT
GLOVE SURG SYN 7.5  E (GLOVE) ×2
GLOVE SURG SYN 7.5 E (GLOVE) ×1 IMPLANT
GOWN STRL REUS W/ TWL LRG LVL3 (GOWN DISPOSABLE) ×3 IMPLANT
GOWN STRL REUS W/TWL LRG LVL3 (GOWN DISPOSABLE) ×6
HEMOSTAT SURGICEL 2X3 (HEMOSTASIS) ×3 IMPLANT
IRRIGATION STRYKERFLOW (MISCELLANEOUS) ×1 IMPLANT
IRRIGATOR STRYKERFLOW (MISCELLANEOUS) ×3
IV CATH ANGIO 12GX3 LT BLUE (NEEDLE) IMPLANT
IV NS 1000ML (IV SOLUTION) ×2
IV NS 1000ML BAXH (IV SOLUTION) ×1 IMPLANT
JACKSON PRATT 10 (INSTRUMENTS) IMPLANT
L-HOOK LAP DISP 36CM (ELECTROSURGICAL) ×3
LABEL OR SOLS (LABEL) ×3 IMPLANT
LHOOK LAP DISP 36CM (ELECTROSURGICAL) ×1 IMPLANT
NDL SAFETY 22GX1.5 (NEEDLE) ×3 IMPLANT
NEEDLE HYPO 22GX1.5 SAFETY (NEEDLE) ×3 IMPLANT
PACK LAP CHOLECYSTECTOMY (MISCELLANEOUS) ×3 IMPLANT
PENCIL ELECTRO HAND CTR (MISCELLANEOUS) ×3 IMPLANT
SCISSORS METZENBAUM CVD 33 (INSTRUMENTS) ×3 IMPLANT
SLEEVE ADV FIXATION 5X100MM (TROCAR) ×9 IMPLANT
SPONGE VERSALON 4X4 4PLY (MISCELLANEOUS) IMPLANT
SUT ETHILON 3 0 PS 1 (SUTURE) ×3 IMPLANT
SUT MNCRL 4-0 (SUTURE) ×2
SUT MNCRL 4-0 27XMFL (SUTURE) ×1
SUT VICRYL 0 AB UR-6 (SUTURE) ×3 IMPLANT
SUTURE MNCRL 4-0 27XMF (SUTURE) ×1 IMPLANT
TROCAR 130MM GELPORT  DAV (MISCELLANEOUS) ×3 IMPLANT
TROCAR Z-THREAD OPTICAL 5X100M (TROCAR) ×3 IMPLANT
TUBING INSUFFLATOR HI FLOW (MISCELLANEOUS) ×3 IMPLANT

## 2016-06-05 NOTE — Op Note (Signed)
Procedure Date:  06/05/2016  Pre-operative Diagnosis:  Biliary Colic  Post-operative Diagnosis: Chronic cholecystitis  Procedure:  Laparoscopic cholecystectomy  Surgeon:  Howie IllJose Luis Catarino Vold, MD  Anesthesia:  General endotracheal  Estimated Blood Loss:  50 ml  Specimens:  gallbladder  Complications:  None  Indications for Procedure:  This is a 26 y.o. female who presents with abdominal pain and prior diagnosis of biliary colic, which she had been getting more frequent flare-ups.  She was seen last week by Dr. Everlene FarrierPabon in the office and was scheduled for cholecystectomy today.  The benefits, complications, treatment options, and expected outcomes were discussed with the patient. The risks of bleeding, infection, recurrence of symptoms, failure to resolve symptoms, bile duct damage, bile duct leak, retained common bile duct stone, bowel injury, and need for further procedures were all discussed with the patient and she was willing to proceed.  Description of Procedure: The patient was correctly identified in the preoperative area and brought into the operating room.  The patient was placed supine with VTE prophylaxis in place.  Appropriate time-outs were performed.  Anesthesia was induced and the patient was intubated.  Appropriate antibiotics were infused.  The abdomen was prepped and draped in a sterile fashion. An infraumbilical incision was made. A cutdown technique was used to enter the abdominal cavity without injury, and a Hasson trocar was inserted.  Pneumoperitoneum was obtained with appropriate opening pressures.  A 5-mm port was placed in the subxiphoid area and two 5-mm ports were placed in the right upper quadrant under direct visualization.  The gallbladder was identified and found to be significantly inflamed and distended.  The fundus was grasped and retracted cephalad, though it would slip off the grasper frequently.  Thus, it was decided to needle decompress the gallbladder,  which was done uneventfully.  The gallbladder fundus was then able to be better retracted.  Adhesions were lysed bluntly and with electrocautery. The stomach and duodenum were mildly distended after bagging prior to intubation and thus an NG tube was inserted with appropriate decompression. However even with that the infundibulum was still not well exposed and the fifth 5 mm port was inserted on the left side of the periumbilical port. Then a retractor was inserted through a able to retract the stomach downwards for better exposure.  The infundibulum was then grasped and retracted laterally, exposing the peritoneum overlying the gallbladder.  This was incised with electrocautery and extended on either side of the gallbladder.  After tedious dissection due to significant inflammation and adhesions, the cystic duct and anterior and posterior cystic arteries were clearly identified and bluntly dissected.  Both were clipped twice proximally and once distally, cutting in between.  The gallbladder was taken from the gallbladder fossa in a retrograde fashion with electrocautery. The gallbladder was placed in an Endocatch bag and brought out via the umbilical incision. The liver bed was inspected and noted to have friable bed, and electrocautery was used to control bleeding. The right upper quadrant was thoroughly irrigated multiple times and any further bleeding was controlled with electrocautery as well. A small piece of Surgicel was left in the gallbladder fossa as well for further hemostasis. A 19 JamaicaFrench JP drain was inserted coming out through the right lateral incision with its tip going along the right upper quadrant underneath the liver bed and gallbladder fossa. The right upper quadrant was then inspected again revealing intact clips, no bleeding, and no ductal injury.  The 5 mm ports were removed under direct visualization  and the Hasson trocar was removed.  The fascial opening was closed using 0 vicryl  suture.  The JP drain was secured to the skin using 3-0 nylon suture. Local anesthetic was infused in all incisions and the incisions were closed with 4-0 Monocryl.  The wounds were cleaned and sealed with DermaBond.  The JP drain site was dressed with 4 x 4 gauze and Tegaderm.  The patient was emerged from anesthesia and extubated and brought to the recovery room for further management.  The patient tolerated the procedure well and all counts were correct at the end of the case.   Howie Ill, MD

## 2016-06-05 NOTE — Interval H&P Note (Signed)
History and Physical Interval Note:  06/05/2016 9:50 AM  Chelsea Richardson  has presented today for surgery, with the diagnosis of Gallstones  The various methods of treatment have been discussed with the patient and family. After consideration of risks, benefits and other options for treatment, the patient has consented to  Procedure(s): LAPAROSCOPIC CHOLECYSTECTOMY (N/A) as a surgical intervention .  The patient's history has been reviewed, patient examined, no change in status, stable for surgery.  I have reviewed the patient's chart and labs.  Questions were answered to the patient's satisfaction.     Suzanne Kho

## 2016-06-05 NOTE — Anesthesia Procedure Notes (Signed)
Procedure Name: Intubation Date/Time: 06/05/2016 10:27 AM Performed by: Jennette Bill Pre-anesthesia Checklist: Patient identified, Patient being monitored, Timeout performed, Emergency Drugs available and Suction available Patient Re-evaluated:Patient Re-evaluated prior to inductionOxygen Delivery Method: Circle system utilized Preoxygenation: Pre-oxygenation with 100% oxygen Intubation Type: IV induction Ventilation: Mask ventilation without difficulty Laryngoscope Size: Mac and 3 Grade View: Grade I Tube type: Oral Tube size: 7.0 mm Number of attempts: 1 Airway Equipment and Method: Stylet Placement Confirmation: ETT inserted through vocal cords under direct vision,  positive ETCO2 and breath sounds checked- equal and bilateral Secured at: 21 cm Tube secured with: Tape Dental Injury: Teeth and Oropharynx as per pre-operative assessment

## 2016-06-05 NOTE — Anesthesia Postprocedure Evaluation (Signed)
Anesthesia Post Note  Patient: Chelsea PenmanBrittany Richardson  Procedure(s) Performed: Procedure(s) (LRB): LAPAROSCOPIC CHOLECYSTECTOMY (N/A)  Patient location during evaluation: PACU Anesthesia Type: General Level of consciousness: awake and alert Pain management: pain level controlled Vital Signs Assessment: post-procedure vital signs reviewed and stable Respiratory status: spontaneous breathing, nonlabored ventilation, respiratory function stable and patient connected to nasal cannula oxygen Cardiovascular status: blood pressure returned to baseline and stable Postop Assessment: no signs of nausea or vomiting Anesthetic complications: no     Last Vitals:  Vitals:   06/05/16 1424 06/05/16 1503  BP: 116/62 (!) 122/58  Pulse: 83 87  Resp: 16   Temp: 36.3 C     Last Pain:  Vitals:   06/05/16 1447  TempSrc:   PainSc: 4                  Alithia Zavaleta S

## 2016-06-05 NOTE — Anesthesia Post-op Follow-up Note (Cosign Needed)
Anesthesia QCDR form completed.        

## 2016-06-05 NOTE — Brief Op Note (Signed)
06/05/2016  1:09 PM  PATIENT:  Chelsea Richardson  26 y.o. female  PRE-OPERATIVE DIAGNOSIS:  Biliary colic  POST-OPERATIVE DIAGNOSIS:  Chronic cholecystitis   PROCEDURE:  Procedure(s): LAPAROSCOPIC CHOLECYSTECTOMY (N/A)  SURGEON:  Surgeon(s) and Role:    * Shaquisha Wynn, MD - Primary  ANESTHESIA:   general  EBL:  Total I/O In: 1700 [I.V.:1700] Out: 50 [Blood:50]  BLOOD ADMINISTERED:none  DRAINS: (19Fr) Jackson-Pratt drain(s) with closed bulb suction in the RUQ   LOCAL MEDICATIONS USED:  BUPIVICAINE   SPECIMEN:  Gallbladder  DISPOSITION OF SPECIMEN:  PATHOLOGY  COUNTS:  YES  TOURNIQUET:  * No tourniquets in log *  DICTATION: .Dragon Dictation  PLAN OF CARE: Admit for overnight observation  PATIENT DISPOSITION:  PACU - hemodynamically stable.   Delay start of Pharmacological VTE agent (>24hrs) due to surgical blood loss or risk of bleeding: yes

## 2016-06-05 NOTE — Transfer of Care (Signed)
Immediate Anesthesia Transfer of Care Note  Patient: Chelsea PenmanBrittany Celaya  Procedure(s) Performed: Procedure(s): LAPAROSCOPIC CHOLECYSTECTOMY (N/A)  Patient Location: PACU  Anesthesia Type:General  Level of Consciousness: awake and alert   Airway & Oxygen Therapy: Patient Spontanous Breathing and Patient connected to face mask oxygen  Post-op Assessment: Report given to RN and Post -op Vital signs reviewed and stable  Post vital signs: Reviewed and stable  Last Vitals:  Vitals:   06/05/16 0911 06/05/16 1302  BP: 127/72 122/72  Pulse: 67 (!) 105  Resp: 20 14  Temp: 36.9 C 36.4 C    Last Pain:  Vitals:   06/05/16 1302  TempSrc: Temporal  PainSc:          Complications: No apparent anesthesia complications

## 2016-06-05 NOTE — Anesthesia Preprocedure Evaluation (Signed)
Anesthesia Evaluation  Patient identified by MRN, date of birth, ID band Patient awake    Reviewed: Allergy & Precautions, NPO status , Patient's Chart, lab work & pertinent test results, reviewed documented beta blocker date and time   Airway Mallampati: III  TM Distance: >3 FB     Dental  (+) Chipped   Pulmonary           Cardiovascular      Neuro/Psych  Headaches,    GI/Hepatic   Endo/Other    Renal/GU      Musculoskeletal   Abdominal   Peds  Hematology   Anesthesia Other Findings Obese.  Reproductive/Obstetrics                             Anesthesia Physical Anesthesia Plan  ASA: III  Anesthesia Plan: General   Post-op Pain Management:    Induction: Intravenous  Airway Management Planned: Oral ETT  Additional Equipment:   Intra-op Plan:   Post-operative Plan:   Informed Consent: I have reviewed the patients History and Physical, chart, labs and discussed the procedure including the risks, benefits and alternatives for the proposed anesthesia with the patient or authorized representative who has indicated his/her understanding and acceptance.     Plan Discussed with: CRNA  Anesthesia Plan Comments:         Anesthesia Quick Evaluation

## 2016-06-05 NOTE — H&P (View-Only) (Signed)
Surgical Consultation  06/01/2016  Chelsea Richardson is an 26 y.o. female.   Chief Complaint  Patient presents with  . Other    To disscuss gallstones & surgery-Previosly seen by Piscoya on 01/27/16     HPI: Chelsea Richardson is a 26 year old female well known to our service with a prior history of symptomatic cholelithiasis. She was seen back in January by Dr. Aleen Campi at that time she was very hesitant about having any surgical intervention. Now she comes back with a recurrent episode that is started 2 days ago with abdominal pain nausea and vomiting. Now her symptoms are better but she still has some sacral tenderness in the right upper quadrant. States that she has intermittent and sharp pain in the right upper quadrant sometimes severe and last for a few minutes. She has acid nausea and vomiting. No evidence of jaundice or cholangitis. She is taking some by mouth but has decreased appetite. Previous ultrasound personally reviewed there is evidence of cholelithiasis. Normal common bile duct and normal LFTs.  She is able to perform more than 4 Mets of activity without any shortness of breath or chest pain and no previous abdominal operations  History reviewed. No pertinent past medical history.  Past Surgical History:  Procedure Laterality Date  . WISDOM TOOTH EXTRACTION      Family History  Problem Relation Age of Onset  . Healthy Mother   . Healthy Father   . Cancer Maternal Grandmother 28       breast    Social History:  reports that she has never smoked. She has never used smokeless tobacco. She reports that she does not drink alcohol or use drugs.  Allergies: No Known Allergies  Medications reviewed.    ROS Full ROS performed and is otherwise negative other than what is stated in the HPI    BP 138/86   Pulse 99   Temp 98.1 F (36.7 C) (Oral)   Ht 5\' 2"  (1.575 m)   Wt 90.7 kg (200 lb)   BMI 36.58 kg/m   Physical Exam  Constitutional: She is oriented to person, place,  and time and well-developed, well-nourished, and in no distress. No distress.  HENT:  Head: Normocephalic.  Eyes: Right eye exhibits no discharge. Left eye exhibits no discharge. No scleral icterus.  Neck: Neck supple. JVD present. No tracheal deviation present. No thyromegaly present.  Cardiovascular: Normal rate and normal heart sounds.   No murmur heard. Pulmonary/Chest: Effort normal and breath sounds normal. No respiratory distress. She has no wheezes. She has no rales.  Abdominal: Soft. She exhibits no distension and no mass. There is tenderness. There is no rebound and no guarding.  RUQ tenderness, no peritonitis, neg murphy.  Musculoskeletal: Normal range of motion. She exhibits no edema.  Neurological: She is alert and oriented to person, place, and time. Gait normal. GCS score is 15.  Skin: Skin is warm and dry. She is not diaphoretic.  Psychiatric: Mood, memory, affect and judgment normal.  Nursing note and vitals reviewed.    Assessment/Plan: Symptomatic cholelithiasis with recurrent dramatic episodes. Discussed with the patient detail I strongly recommend laparoscopic cholecystectomy. Discussed with her and I do think that we need to do this sooner rather than later. I gave her the option of waiting 2 more weeks versus having Dr. Marcene Duos do her laparoscopic cholecystectomy in 3 days. She wishes to go ahead and have her gallbladder removed this coming Monday. I discussed the procedure in detail.  The patient was given  Agricultural engineereducational material.  We discussed the risks and benefits of a laparoscopic cholecystectomy and possible cholangiogram including, but not limited to bleeding, infection, injury to surrounding structures such as the intestine or liver, bile leak, retained gallstones, need to convert to an open procedure, prolonged diarrhea, blood clots such as  DVT, common bile duct injury, anesthesia risks, and possible need for additional procedures.  The likelihood of  improvement in symptoms and return to the patient's normal status is good. We discussed the typical post-operative recovery course.   Sterling Bigiego Khandi Kernes, MD [facility] General Surgeon

## 2016-06-06 ENCOUNTER — Encounter: Payer: Self-pay | Admitting: Surgery

## 2016-06-06 ENCOUNTER — Telehealth: Payer: Self-pay

## 2016-06-06 DIAGNOSIS — K8012 Calculus of gallbladder with acute and chronic cholecystitis without obstruction: Secondary | ICD-10-CM | POA: Diagnosis not present

## 2016-06-06 LAB — COMPREHENSIVE METABOLIC PANEL
ALT: 97 U/L — ABNORMAL HIGH (ref 14–54)
ANION GAP: 7 (ref 5–15)
AST: 73 U/L — AB (ref 15–41)
Albumin: 3.9 g/dL (ref 3.5–5.0)
Alkaline Phosphatase: 67 U/L (ref 38–126)
BILIRUBIN TOTAL: 0.5 mg/dL (ref 0.3–1.2)
BUN: 8 mg/dL (ref 6–20)
CHLORIDE: 105 mmol/L (ref 101–111)
CO2: 25 mmol/L (ref 22–32)
Calcium: 9.1 mg/dL (ref 8.9–10.3)
Creatinine, Ser: 0.73 mg/dL (ref 0.44–1.00)
GFR calc Af Amer: >60 mL/min (ref >60)
Glucose, Bld: 96 mg/dL (ref 65–99)
POTASSIUM: 3.8 mmol/L (ref 3.5–5.1)
Sodium: 137 mmol/L (ref 135–145)
TOTAL PROTEIN: 7 g/dL (ref 6.5–8.1)

## 2016-06-06 LAB — CBC
HEMATOCRIT: 37.2 % (ref 35.0–47.0)
Hemoglobin: 13.2 g/dL (ref 12.0–16.0)
MCH: 29.6 pg (ref 26.0–34.0)
MCHC: 35.4 g/dL (ref 32.0–36.0)
MCV: 83.5 fL (ref 80.0–100.0)
PLATELETS: 305 10*3/uL (ref 150–440)
RBC: 4.46 MIL/uL (ref 3.80–5.20)
RDW: 12.4 % (ref 11.5–14.5)
WBC: 10.9 10*3/uL (ref 3.6–11.0)

## 2016-06-06 LAB — SURGICAL PATHOLOGY

## 2016-06-06 MED ORDER — OXYCODONE HCL 5 MG PO TABS: 5.0000 mg | ORAL_TABLET | ORAL | 0 refills | Status: DC | PRN
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
## 2016-06-06 NOTE — Progress Notes (Signed)
IV was removed. Discharge instructions, follow-up appointments, and prescriptions were provided to the pt and husband at bedside. The pt was taken downstairs via wheelchair by NT.  

## 2016-06-06 NOTE — Progress Notes (Signed)
JP drain removed per MD order. 

## 2016-06-06 NOTE — Discharge Summary (Signed)
Patient ID: Eulah Walkup MRN: [id number] DOB/AGE: July 27, 1990 26 y.o.  Admit date: 06/05/2016 Discharge date: 06/06/2016   Discharge Diagnoses:  Active Problems:   Chronic cholecystitis   Procedures:  Laparoscopic Cholecystectomy  Hospital Course:  Patient was taken to the OR on 5/21 for elective laparoscopic cholecystectomy for biliary colic.  She was found to have chronic cholecystitis and had significant adhesions.  A JP drain was left in place and the patient was admitted overnight for observation.  Her diet was slowly advanced and was tolerating a regular diet by POD#1.  Her pain was well controlled.  JP drain had serosanguinous drainage only.  Her post-op labs were within normal limits and was deemed ready for discharge to home.  JP drain was removed prior to discharge.  On exam, she was in no acute distress with stable vital signs.  Her abdomen was soft, non-distended, and appropriately tender to palpation.  All incisions were clean, dry, and intact with DermaBond.  JP drain was intact with serosanguinous fluid.    Consults: None  Disposition: 01-Home or Self Care  Discharge Instructions    Call MD for:  difficulty breathing, headache or visual disturbances    Complete by:  As directed    Call MD for:  persistant nausea and vomiting    Complete by:  As directed    Call MD for:  redness, tenderness, or signs of infection (pain, swelling, redness, odor or green/yellow discharge around incision site)    Complete by:  As directed    Call MD for:  severe uncontrolled pain    Complete by:  As directed    Call MD for:  temperature >100.4    Complete by:  As directed    Change dressing (specify)    Complete by:  As directed    May change JP site gauze as needed and/or once daily.  When wound closes, no further dressing is needed.   Diet - low sodium heart healthy    Complete by:  As directed    Discharge instructions    Complete by:  As directed    1.  Patient may shower, but  do not scrub wounds heavily and dab dry only. 2.  Do not submerge wounds in pool/tub. 3.  Do not apply ointments or hydrogen peroxide to the wounds. 4.  May take Tylenol and Ibuprofen as needed for pain control in addition to Oxycodone prescription. 5.  May take MiraLax daily as needed for constipation.   Driving Restrictions    Complete by:  As directed    Do not drive while taking narcotics for pain control.   Increase activity slowly    Complete by:  As directed    Lifting restrictions    Complete by:  As directed    No heavy lifting or pushing of more than 10-15 lbs for 4 weeks.     Allergies as of 06/06/2016   No Known Allergies     Medication List    STOP taking these medications   ondansetron 8 MG disintegrating tablet Commonly known as:  ZOFRAN ODT     TAKE these medications   ibuprofen 200 MG tablet Commonly known as:  ADVIL,MOTRIN Take 400 mg by mouth every 6 (six) hours as needed.   oxyCODONE 5 MG immediate release tablet Commonly known as:  Oxy IR/ROXICODONE Take 1-2 tablets (5-10 mg total) by mouth every 4 (four) hours as needed for moderate pain.      Follow-up Information  Leafy RoPabon, Diego F, MD Follow up on 06/22/2016.   Specialty:  General Surgery Why:  [facility] [place] for 6/718 Time:  9:30 am.  Please arrive by 9:15 am for check-in Contact information: [address] [place] [id number] [phone]

## 2016-06-22 ENCOUNTER — Ambulatory Visit (INDEPENDENT_AMBULATORY_CARE_PROVIDER_SITE_OTHER): Payer: 59 | Admitting: Surgery

## 2016-06-22 ENCOUNTER — Encounter: Payer: Self-pay | Admitting: Surgery

## 2016-06-22 VITALS — BP 138/70 | HR 78 | Temp 98.1°F | Ht 62.0 in | Wt 201.0 lb

## 2016-06-22 DIAGNOSIS — Z09 Encounter for follow-up examination after completed treatment for conditions other than malignant neoplasm: Secondary | ICD-10-CM

## 2016-06-22 NOTE — Progress Notes (Signed)
S/p lap chole by Dr. Aleen CampiPiscoya 5/21 Doing well Some scant drainage from umbilical incision Taking po, no fever or chills Path d/w pt  PE : NAD Abd: soft, nt, umbilical incision w some fibrinous material and scant serous drainage. Band-Aid placed. No infection, no peritontis

## 2016-06-22 NOTE — Patient Instructions (Signed)
Please call our office with any questions or concerns.  Please do not submerge in a tub, hot tub, or pool until incisions are completely sealed.  Use sun block to incision area over the next year if this area will be exposed to sun. This helps decrease scarring.  You may resume your normal activities on 07/17/16. At that time- Listen to your body when lifting, if you have pain when lifting, stop and then try again in a few days. Pain after doing exercises or activities of daily living is normal as you get back in to your normal routine.  If you develop redness, drainage, or pain at incision sites- call our office immediately and speak with a nurse.

## 2019-08-02 ENCOUNTER — Other Ambulatory Visit: Payer: Self-pay

## 2019-08-02 ENCOUNTER — Emergency Department: Payer: 59

## 2019-08-02 ENCOUNTER — Emergency Department
Admission: EM | Admit: 2019-08-02 | Discharge: 2019-08-02 | Disposition: A | Discharge status: Home / Self Care | Payer: 59 | Attending: Emergency Medicine | Admitting: Emergency Medicine

## 2019-08-02 ENCOUNTER — Encounter: Payer: Self-pay | Admitting: Emergency Medicine

## 2019-08-02 DIAGNOSIS — S4292XA Fracture of left shoulder girdle, part unspecified, initial encounter for closed fracture: Secondary | ICD-10-CM | POA: Diagnosis not present

## 2019-08-02 DIAGNOSIS — Y999 Unspecified external cause status: Secondary | ICD-10-CM | POA: Insufficient documentation

## 2019-08-02 DIAGNOSIS — Y93I9 Activity, other involving external motion: Secondary | ICD-10-CM | POA: Insufficient documentation

## 2019-08-02 DIAGNOSIS — S43012A Anterior subluxation of left humerus, initial encounter: Secondary | ICD-10-CM | POA: Insufficient documentation

## 2019-08-02 DIAGNOSIS — Y929 Unspecified place or not applicable: Secondary | ICD-10-CM | POA: Diagnosis not present

## 2019-08-02 DIAGNOSIS — S4992XA Unspecified injury of left shoulder and upper arm, initial encounter: Secondary | ICD-10-CM | POA: Diagnosis present

## 2019-08-02 MED ORDER — ETOMIDATE 2 MG/ML IV SOLN
10.0000 mg | Freq: Once | INTRAVENOUS | Status: AC
  Administered 2019-08-02: 13:00:00 10 mg via INTRAVENOUS
  Filled 2019-08-02: qty 10

## 2019-08-02 MED ORDER — KETOROLAC TROMETHAMINE 60 MG/2ML IM SOLN
60.0000 mg | Freq: Once | INTRAMUSCULAR | Status: AC
  Administered 2019-08-02: 11:00:00 60 mg via INTRAMUSCULAR
  Filled 2019-08-02: qty 2

## 2019-08-02 MED ORDER — HYDROMORPHONE HCL 1 MG/ML IJ SOLN
1.0000 mg | Freq: Once | INTRAMUSCULAR | Status: AC
  Administered 2019-08-02: 11:00:00 1 mg via INTRAMUSCULAR
  Filled 2019-08-02: qty 1

## 2019-08-02 MED ORDER — SODIUM CHLORIDE 0.9 % IV BOLUS
1000.0000 mL | Freq: Once | INTRAVENOUS | Status: AC
  Administered 2019-08-02: 13:00:00 1000 mL via INTRAVENOUS

## 2019-08-02 NOTE — ED Notes (Signed)
Report given Jae Dire RN  Pt moved to room 19 via W/C
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
## 2019-08-02 NOTE — ED Notes (Signed)
Pt provided water, ok with EDP.
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
## 2019-08-02 NOTE — Sedation Documentation (Signed)
X-ray at bedside
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
## 2019-08-02 NOTE — ED Notes (Signed)
ED Provider at bedside. 
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
## 2019-08-15 ENCOUNTER — Other Ambulatory Visit: Payer: Self-pay | Admitting: Sports Medicine

## 2019-08-15 DIAGNOSIS — S43015A Anterior dislocation of left humerus, initial encounter: Secondary | ICD-10-CM

## 2019-08-15 DIAGNOSIS — M25512 Pain in left shoulder: Secondary | ICD-10-CM

## 2019-08-15 DIAGNOSIS — S43492A Other sprain of left shoulder joint, initial encounter: Secondary | ICD-10-CM

## 2019-08-15 DIAGNOSIS — S42145A Nondisplaced fracture of glenoid cavity of scapula, left shoulder, initial encounter for closed fracture: Secondary | ICD-10-CM

## 2019-09-03 ENCOUNTER — Other Ambulatory Visit: Payer: Self-pay | Admitting: Sports Medicine

## 2019-09-03 ENCOUNTER — Ambulatory Visit
Admission: RE | Admit: 2019-09-03 | Discharge: 2019-09-03 | Disposition: A | Discharge status: Home / Self Care | Payer: 59 | Source: Ambulatory Visit | Attending: Sports Medicine | Admitting: Sports Medicine

## 2019-09-03 ENCOUNTER — Other Ambulatory Visit: Payer: Self-pay

## 2019-09-03 DIAGNOSIS — M25512 Pain in left shoulder: Secondary | ICD-10-CM

## 2019-09-03 DIAGNOSIS — S43492A Other sprain of left shoulder joint, initial encounter: Secondary | ICD-10-CM | POA: Insufficient documentation

## 2019-09-03 DIAGNOSIS — S42145A Nondisplaced fracture of glenoid cavity of scapula, left shoulder, initial encounter for closed fracture: Secondary | ICD-10-CM

## 2019-09-03 DIAGNOSIS — S43015A Anterior dislocation of left humerus, initial encounter: Secondary | ICD-10-CM

## 2019-09-10 ENCOUNTER — Other Ambulatory Visit: Payer: Self-pay | Admitting: Orthopedic Surgery

## 2019-09-11 ENCOUNTER — Other Ambulatory Visit
Admission: RE | Admit: 2019-09-11 | Discharge: 2019-09-11 | Disposition: A | Discharge status: Home / Self Care | Payer: 59 | Source: Ambulatory Visit | Attending: Orthopedic Surgery | Admitting: Orthopedic Surgery

## 2019-09-11 ENCOUNTER — Other Ambulatory Visit: Payer: Self-pay | Admitting: Orthopedic Surgery

## 2019-09-11 ENCOUNTER — Encounter
Admission: RE | Admit: 2019-09-11 | Discharge: 2019-09-11 | Disposition: A | Discharge status: Home / Self Care | Payer: 59 | Source: Ambulatory Visit | Attending: Orthopedic Surgery | Admitting: Orthopedic Surgery

## 2019-09-11 ENCOUNTER — Other Ambulatory Visit: Payer: Self-pay

## 2019-09-11 DIAGNOSIS — Z01812 Encounter for preprocedural laboratory examination: Secondary | ICD-10-CM | POA: Diagnosis not present

## 2019-09-11 DIAGNOSIS — Z20822 Contact with and (suspected) exposure to covid-19: Secondary | ICD-10-CM | POA: Insufficient documentation

## 2019-09-11 LAB — SARS CORONAVIRUS 2 (TAT 6-24 HRS): SARS Coronavirus 2: NEGATIVE

## 2019-09-11 NOTE — Patient Instructions (Addendum)
Your procedure is scheduled on:  09-15-19 MONDAY Report to Same Day Surgery 2nd floor medical mall [facility] Entrance-take elevator on left to 2nd floor.  Check in with surgery information desk.) To find out your arrival time please call [phone] between 1PM - 3PM on 09-12-19 FRIDAY  Remember: Instructions that are not followed completely may result in serious medical risk, up to and including death, or upon the discretion of your surgeon and anesthesiologist your surgery may need to be rescheduled.    _x___ 1. Do not eat food after midnight the night before your procedure. NO GUM OR CANDY AFTER MIDNIGHT.  You may drink clear liquids up to 2 hours before you are scheduled to arrive at the hospital for your procedure.  Do not drink clear liquids within 2 hours of your scheduled arrival to the hospital.  Clear liquids include  --Water or Apple juice without pulp  -- Gatorade  --Black Coffee or Clear Tea (No milk, no creamers, do not add anything to the coffee or Tea-OK TO ADD SUGAR) Type 1 and type 2 diabetics should only drink water.   __x__ 2. No Alcohol for 24 hours before or after surgery.   __x__3. No Smoking or e-cigarettes for 24 prior to surgery.  Do not use any chewable tobacco products for at least 6 hour prior to surgery   ____  4. Bring all medications with you on the day of surgery if instructed.    __x__ 5. Notify your doctor if there is any change in your medical condition     (cold, fever, infections).    x___6. On the morning of surgery brush your teeth with toothpaste and water.  You may rinse your mouth with mouth wash if you wish.  Do not swallow any toothpaste or mouthwash.   Do not wear jewelry, make-up, hairpins, clips or nail polish.  Do not wear lotions, powders, or perfumes. You may wear deodorant.  Do not shave 48 hours prior to surgery. Men may shave face and neck.  Do not bring valuables to the hospital.    [facility] is not responsible for any  belongings or valuables.               Contacts, dentures or bridgework may not be worn into surgery.  Leave your suitcase in the car. After surgery it may be brought to your room.  For patients admitted to the hospital, discharge time is determined by your treatment team.  _  Patients discharged the day of surgery will not be allowed to drive home.  You will need someone to drive you home and stay with you the night of your procedure.    Please read over the following fact sheets that you were given:   [facility] Preparing for Surgery   ____ Take anti-hypertensive listed below, cardiac, seizure, asthma, anti-reflux and psychiatric medicines. These include:  1. NONE  2.  3.  4.  5.  6.  ____Fleets enema or Magnesium Citrate as directed.   ____ Use CHG Soap or sage wipes as directed on instruction sheet   ____ Use inhalers on the day of surgery and bring to hospital day of surgery  ____ Stop Metformin and Janumet 2 days prior to surgery.    ____ Take 1/2 of usual insulin dose the night before surgery and none on the morning surgery.   _x___ Follow recommendations from Cardiologist, Pulmonologist or PCP regarding stopping Aspirin, Coumadin, Plavix ,Eliquis, Effient, or Pradaxa, and Pletal-STOP  81 MG ASPIRIN NOW  X____Stop Anti-inflammatories such as Advil, Aleve, Ibuprofen, Motrin, Naproxen, Naprosyn, Goodies powders or aspirin products NOW-OK to take Tylenol    _x___ Stop supplements until after surgery-STOP YOUR ELDERBERRY AND IMMUNITY BOOSTER SUPPORT NOW-YOU MAY RESUME AFTER SURGERY   ____ Bring C-Pap to the hospital.

## 2019-09-15 ENCOUNTER — Encounter: Payer: Self-pay | Admitting: Orthopedic Surgery

## 2019-09-15 ENCOUNTER — Other Ambulatory Visit: Payer: Self-pay

## 2019-09-15 ENCOUNTER — Ambulatory Visit: Payer: 59 | Admitting: Anesthesiology

## 2019-09-15 ENCOUNTER — Ambulatory Visit
Admission: RE | Admit: 2019-09-15 | Discharge: 2019-09-15 | Disposition: A | Discharge status: Home / Self Care | Payer: 59 | Attending: Orthopedic Surgery | Admitting: Orthopedic Surgery

## 2019-09-15 ENCOUNTER — Ambulatory Visit: Payer: 59

## 2019-09-15 ENCOUNTER — Encounter
Admission: RE | Disposition: A | Discharge status: Home / Self Care | Payer: Self-pay | Source: Home / Self Care | Attending: Orthopedic Surgery

## 2019-09-15 DIAGNOSIS — M899 Disorder of bone, unspecified: Secondary | ICD-10-CM | POA: Diagnosis not present

## 2019-09-15 DIAGNOSIS — Z7982 Long term (current) use of aspirin: Secondary | ICD-10-CM | POA: Diagnosis not present

## 2019-09-15 DIAGNOSIS — S43005A Unspecified dislocation of left shoulder joint, initial encounter: Secondary | ICD-10-CM | POA: Insufficient documentation

## 2019-09-15 DIAGNOSIS — Z419 Encounter for procedure for purposes other than remedying health state, unspecified: Secondary | ICD-10-CM

## 2019-09-15 HISTORY — PX: SHOULDER ARTHROSCOPY WITH LABRAL REPAIR: SHX5691

## 2019-09-15 LAB — POCT PREGNANCY, URINE: Preg Test, Ur: NEGATIVE

## 2019-09-15 SURGERY — ARTHROSCOPY, SHOULDER, WITH GLENOID LABRUM REPAIR
Anesthesia: General | Site: Shoulder | Laterality: Left

## 2019-09-15 MED ORDER — ACETAMINOPHEN 10 MG/ML IV SOLN
INTRAVENOUS | Status: DC | PRN
  Administered 2019-09-15: 1000 mg via INTRAVENOUS

## 2019-09-15 MED ORDER — LIDOCAINE HCL (PF) 1 % IJ SOLN
INTRAMUSCULAR | Status: AC
  Filled 2019-09-15: qty 30

## 2019-09-15 MED ORDER — ONDANSETRON 4 MG PO TBDP: 4.0000 mg | ORAL_TABLET | Freq: Three times a day (TID) | ORAL | 0 refills | Status: AC | PRN

## 2019-09-15 MED ORDER — ONDANSETRON HCL 4 MG/2ML IJ SOLN
INTRAMUSCULAR | Status: AC
  Filled 2019-09-15: qty 2

## 2019-09-15 MED ORDER — BUPIVACAINE HCL (PF) 0.5 % IJ SOLN
INTRAMUSCULAR | Status: DC | PRN
  Administered 2019-09-15: 10 mL via PERINEURAL

## 2019-09-15 MED ORDER — KETOROLAC TROMETHAMINE 30 MG/ML IJ SOLN
INTRAMUSCULAR | Status: AC
  Filled 2019-09-15: qty 1

## 2019-09-15 MED ORDER — LIDOCAINE HCL (PF) 1 % IJ SOLN
INTRAMUSCULAR | Status: DC | PRN
  Administered 2019-09-15: 3 mL

## 2019-09-15 MED ORDER — KETOROLAC TROMETHAMINE 30 MG/ML IJ SOLN
INTRAMUSCULAR | Status: DC | PRN
  Administered 2019-09-15: 30 mg via INTRAVENOUS

## 2019-09-15 MED ORDER — FENTANYL CITRATE (PF) 100 MCG/2ML IJ SOLN
INTRAMUSCULAR | Status: AC
  Filled 2019-09-15: qty 2

## 2019-09-15 MED ORDER — LIDOCAINE HCL (PF) 2 % IJ SOLN
INTRAMUSCULAR | Status: AC
  Filled 2019-09-15: qty 5

## 2019-09-15 MED ORDER — PROMETHAZINE HCL 25 MG/ML IJ SOLN: 6.2500 mg | INTRAMUSCULAR | Status: DC | PRN

## 2019-09-15 MED ORDER — MEPERIDINE HCL 50 MG/ML IJ SOLN: 6.2500 mg | INTRAMUSCULAR | Status: DC | PRN

## 2019-09-15 MED ORDER — CHLORHEXIDINE GLUCONATE 0.12 % MT SOLN: 15.0000 mL | Freq: Once | OROMUCOSAL | Status: AC

## 2019-09-15 MED ORDER — ROCURONIUM BROMIDE 100 MG/10ML IV SOLN
INTRAVENOUS | Status: DC | PRN
  Administered 2019-09-15: 30 mg via INTRAVENOUS
  Administered 2019-09-15: 50 mg via INTRAVENOUS
  Administered 2019-09-15: 20 mg via INTRAVENOUS

## 2019-09-15 MED ORDER — MIDAZOLAM HCL 2 MG/2ML IJ SOLN: 1.0000 mg | Freq: Once | INTRAMUSCULAR | Status: AC

## 2019-09-15 MED ORDER — ACETAMINOPHEN 500 MG PO TABS: 1000.0000 mg | ORAL_TABLET | Freq: Three times a day (TID) | ORAL | 2 refills | Status: DC

## 2019-09-15 MED ORDER — LIDOCAINE HCL (CARDIAC) PF 100 MG/5ML IV SOSY
PREFILLED_SYRINGE | INTRAVENOUS | Status: DC | PRN
  Administered 2019-09-15: 100 mg via INTRAVENOUS

## 2019-09-15 MED ORDER — PHENYLEPHRINE HCL-NACL 10-0.9 MG/250ML-% IV SOLN
INTRAVENOUS | Status: DC | PRN
  Administered 2019-09-15: 80 ug/min via INTRAVENOUS

## 2019-09-15 MED ORDER — PROPOFOL 10 MG/ML IV BOLUS
INTRAVENOUS | Status: DC | PRN
  Administered 2019-09-15: 200 mg via INTRAVENOUS

## 2019-09-15 MED ORDER — ONDANSETRON 4 MG PO TBDP: 4.0000 mg | ORAL_TABLET | Freq: Three times a day (TID) | ORAL | 0 refills | Status: DC | PRN

## 2019-09-15 MED ORDER — ONDANSETRON HCL 4 MG/2ML IJ SOLN
INTRAMUSCULAR | Status: DC | PRN
  Administered 2019-09-15: 4 mg via INTRAVENOUS

## 2019-09-15 MED ORDER — CEFAZOLIN SODIUM-DEXTROSE 2-4 GM/100ML-% IV SOLN
2.0000 g | INTRAVENOUS | Status: AC
  Administered 2019-09-15: 2 g via INTRAVENOUS

## 2019-09-15 MED ORDER — LIDOCAINE HCL (PF) 1 % IJ SOLN
INTRAMUSCULAR | Status: AC
  Filled 2019-09-15: qty 5

## 2019-09-15 MED ORDER — ASPIRIN EC 325 MG PO TBEC: 325.0000 mg | DELAYED_RELEASE_TABLET | Freq: Every day | ORAL | 0 refills | Status: DC

## 2019-09-15 MED ORDER — MIDAZOLAM HCL 2 MG/2ML IJ SOLN
INTRAMUSCULAR | Status: AC
  Filled 2019-09-15: qty 2

## 2019-09-15 MED ORDER — BUPIVACAINE HCL (PF) 0.5 % IJ SOLN
INTRAMUSCULAR | Status: AC
  Filled 2019-09-15: qty 10

## 2019-09-15 MED ORDER — MIDAZOLAM HCL 2 MG/2ML IJ SOLN
INTRAMUSCULAR | Status: DC | PRN
  Administered 2019-09-15: 2 mg via INTRAVENOUS

## 2019-09-15 MED ORDER — ONDANSETRON HCL 4 MG/2ML IJ SOLN
INTRAMUSCULAR | Status: AC
  Administered 2019-09-15: 4 mg via INTRAVENOUS
  Filled 2019-09-15: qty 2

## 2019-09-15 MED ORDER — FENTANYL CITRATE (PF) 100 MCG/2ML IJ SOLN
INTRAMUSCULAR | Status: AC
  Administered 2019-09-15: 50 ug via INTRAVENOUS
  Filled 2019-09-15: qty 2

## 2019-09-15 MED ORDER — OXYCODONE HCL 5 MG/5ML PO SOLN: 5.0000 mg | Freq: Once | ORAL | Status: DC | PRN

## 2019-09-15 MED ORDER — FENTANYL CITRATE (PF) 100 MCG/2ML IJ SOLN: 25.0000 ug | INTRAMUSCULAR | Status: DC | PRN

## 2019-09-15 MED ORDER — ACETAMINOPHEN 500 MG PO TABS: 1000.0000 mg | ORAL_TABLET | Freq: Three times a day (TID) | ORAL | 2 refills | Status: AC

## 2019-09-15 MED ORDER — OXYCODONE HCL 5 MG PO TABS: 5.0000 mg | ORAL_TABLET | Freq: Once | ORAL | Status: DC | PRN

## 2019-09-15 MED ORDER — PROPOFOL 10 MG/ML IV BOLUS
INTRAVENOUS | Status: AC
  Filled 2019-09-15: qty 20

## 2019-09-15 MED ORDER — SEVOFLURANE IN SOLN
RESPIRATORY_TRACT | Status: AC
  Filled 2019-09-15: qty 250

## 2019-09-15 MED ORDER — FAMOTIDINE 20 MG PO TABS: 20.0000 mg | ORAL_TABLET | Freq: Once | ORAL | Status: AC

## 2019-09-15 MED ORDER — MIDAZOLAM HCL 2 MG/2ML IJ SOLN
INTRAMUSCULAR | Status: AC
  Administered 2019-09-15: 1 mg via INTRAVENOUS
  Filled 2019-09-15: qty 2

## 2019-09-15 MED ORDER — LACTATED RINGERS IV SOLN: INTRAVENOUS | Status: DC

## 2019-09-15 MED ORDER — ONDANSETRON HCL 4 MG/2ML IJ SOLN: 4.0000 mg | Freq: Once | INTRAMUSCULAR | Status: AC

## 2019-09-15 MED ORDER — ORAL CARE MOUTH RINSE: 15.0000 mL | Freq: Once | OROMUCOSAL | Status: AC

## 2019-09-15 MED ORDER — ASPIRIN EC 325 MG PO TBEC: 325.0000 mg | DELAYED_RELEASE_TABLET | Freq: Every day | ORAL | 0 refills | Status: AC

## 2019-09-15 MED ORDER — EPINEPHRINE PF 1 MG/ML IJ SOLN
INTRAMUSCULAR | Status: AC
  Filled 2019-09-15: qty 4

## 2019-09-15 MED ORDER — OXYCODONE HCL 5 MG PO TABS: 5.0000 mg | ORAL_TABLET | ORAL | 0 refills | Status: AC | PRN

## 2019-09-15 MED ORDER — PHENYLEPHRINE HCL (PRESSORS) 10 MG/ML IV SOLN
INTRAVENOUS | Status: DC | PRN
  Administered 2019-09-15 (×4): 100 ug via INTRAVENOUS

## 2019-09-15 MED ORDER — DEXAMETHASONE SODIUM PHOSPHATE 10 MG/ML IJ SOLN
INTRAMUSCULAR | Status: AC
  Filled 2019-09-15: qty 1

## 2019-09-15 MED ORDER — CEFAZOLIN SODIUM-DEXTROSE 2-4 GM/100ML-% IV SOLN
INTRAVENOUS | Status: AC
  Filled 2019-09-15: qty 100

## 2019-09-15 MED ORDER — BUPIVACAINE LIPOSOME 1.3 % IJ SUSP
INTRAMUSCULAR | Status: AC
  Filled 2019-09-15: qty 20

## 2019-09-15 MED ORDER — FENTANYL CITRATE (PF) 100 MCG/2ML IJ SOLN: 50.0000 ug | Freq: Once | INTRAMUSCULAR | Status: AC

## 2019-09-15 MED ORDER — DEXAMETHASONE SODIUM PHOSPHATE 10 MG/ML IJ SOLN
INTRAMUSCULAR | Status: DC | PRN
  Administered 2019-09-15: 10 mg via INTRAVENOUS

## 2019-09-15 MED ORDER — CHLORHEXIDINE GLUCONATE 0.12 % MT SOLN
OROMUCOSAL | Status: AC
  Administered 2019-09-15: 15 mL via OROMUCOSAL
  Filled 2019-09-15: qty 15

## 2019-09-15 MED ORDER — BUPIVACAINE LIPOSOME 1.3 % IJ SUSP
INTRAMUSCULAR | Status: DC | PRN
  Administered 2019-09-15: 20 mL via PERINEURAL

## 2019-09-15 MED ORDER — OXYCODONE HCL 5 MG PO TABS: 5.0000 mg | ORAL_TABLET | ORAL | 0 refills | Status: DC | PRN

## 2019-09-15 MED ORDER — FENTANYL CITRATE (PF) 100 MCG/2ML IJ SOLN
INTRAMUSCULAR | Status: DC | PRN
Start: 2019-09-15 — End: 2019-09-15
  Administered 2019-09-15: 100 ug via INTRAVENOUS
  Administered 2019-09-15 (×2): 50 ug via INTRAVENOUS

## 2019-09-15 MED ORDER — ACETAMINOPHEN 10 MG/ML IV SOLN
INTRAVENOUS | Status: AC
  Filled 2019-09-15: qty 100

## 2019-09-15 MED ORDER — LACTATED RINGERS IV SOLN
INTRAVENOUS | Status: DC | PRN
  Administered 2019-09-15: 4 mL

## 2019-09-15 MED ORDER — ROCURONIUM BROMIDE 10 MG/ML (PF) SYRINGE
PREFILLED_SYRINGE | INTRAVENOUS | Status: AC
  Filled 2019-09-15: qty 10

## 2019-09-15 MED ORDER — FAMOTIDINE 20 MG PO TABS
ORAL_TABLET | ORAL | Status: AC
  Administered 2019-09-15: 20 mg via ORAL
  Filled 2019-09-15: qty 1

## 2019-09-15 SURGICAL SUPPLY — 67 items
ADAPTER IRRIG TUBE 2 SPIKE SOL (ADAPTER) ×6 IMPLANT
ADPR TBG 2 SPK PMP STRL ASCP (ADAPTER) ×2
ANCH SUT 2 SUTTK 12X2.4 STRL (Anchor) ×1 IMPLANT
ANCH SUT SHRT 12.5 CANN EYLT (Anchor) ×4 IMPLANT
ANCHOR SUT 1.8 FBRTK KNTLS 2SU (Anchor) ×6 IMPLANT
ANCHOR SUT BIOCOMP LK 2.9X12.5 (Anchor) ×12 IMPLANT
ANCHOR SUTURETAK 2.4X12 BIOC # (Anchor) ×3 IMPLANT
APL PRP STRL LF DISP 70% ISPRP (MISCELLANEOUS) ×1
BUR RADIUS 4.0X18.5 (BURR) ×3 IMPLANT
BUR RADIUS 5.5 (BURR) ×3 IMPLANT
CANNULA 5.75X7CM (CANNULA) ×2
CANNULA PART THRD DISP 5.75X7 (CANNULA) ×4 IMPLANT
CANNULA PARTIAL THREAD 2X7 (CANNULA) ×3 IMPLANT
CANNULA TWIST IN 8.25X9CM (CANNULA) ×6 IMPLANT
CHLORAPREP W/TINT 26 (MISCELLANEOUS) ×3 IMPLANT
COOLER POLAR GLACIER W/PUMP (MISCELLANEOUS) ×3 IMPLANT
DEVICE SUCT BLK HOLE OR FLOOR (MISCELLANEOUS) ×3 IMPLANT
DRAPE 3/4 80X56 (DRAPES) ×3 IMPLANT
DRAPE IMP U-DRAPE 54X76 (DRAPES) ×6 IMPLANT
DRAPE INCISE IOBAN 66X45 STRL (DRAPES) ×3 IMPLANT
DRAPE U-SHAPE 47X51 STRL (DRAPES) ×3 IMPLANT
ELECT REM PT RETURN 9FT ADLT (ELECTROSURGICAL)
ELECTRODE REM PT RTRN 9FT ADLT (ELECTROSURGICAL) IMPLANT
GAUZE 4X4 16PLY RFD (DISPOSABLE) ×3 IMPLANT
GAUZE SPONGE 4X4 12PLY STRL (GAUZE/BANDAGES/DRESSINGS) ×3 IMPLANT
GAUZE XEROFORM 1X8 LF (GAUZE/BANDAGES/DRESSINGS) ×3 IMPLANT
GLOVE BIOGEL PI IND STRL 8 (GLOVE) ×1 IMPLANT
GLOVE BIOGEL PI INDICATOR 8 (GLOVE) ×2
GLOVE SURG ORTHO 8.0 STRL STRW (GLOVE) ×6 IMPLANT
GLOVE SURG SYN 8.0 (GLOVE) ×3 IMPLANT
GOWN STRL REUS W/ TWL LRG LVL3 (GOWN DISPOSABLE) ×1 IMPLANT
GOWN STRL REUS W/TWL LRG LVL3 (GOWN DISPOSABLE) ×3
GOWN STRL REUS W/TWL XL LVL3 (GOWN DISPOSABLE) ×3 IMPLANT
IV LACTATED RINGER IRRG 3000ML (IV SOLUTION) ×126
IV LR IRRIG 3000ML ARTHROMATIC (IV SOLUTION) ×42 IMPLANT
KIT BIO-SUTURETAK 2.4 SPR TROC (KITS) ×3 IMPLANT
KIT CVD SPEAR FBRTK 1.8 DRILL (KITS) ×3 IMPLANT
KIT INSERTION 2.9 PUSHLOCK (KITS) ×3 IMPLANT
KIT STABILIZATION SHOULDER (MISCELLANEOUS) ×3 IMPLANT
KIT SUTURETAK 3.0 INSERT PERC (KITS) IMPLANT
KIT TURNOVER KIT A (KITS) ×3 IMPLANT
LASSO QUICK PASS 45DEG CVD RT (SUTURE) ×3 IMPLANT
MANIFOLD NEPTUNE II (INSTRUMENTS) ×3 IMPLANT
MASK FACE SPIDER DISP (MASK) ×3 IMPLANT
MAT ABSORB  FLUID 56X50 GRAY (MISCELLANEOUS) ×4
MAT ABSORB FLUID 56X50 GRAY (MISCELLANEOUS) ×2 IMPLANT
PACK SHDR ARTHRO (MISCELLANEOUS) ×3 IMPLANT
PAD ABD DERMACEA PRESS 5X9 (GAUZE/BANDAGES/DRESSINGS) IMPLANT
PAD WRAPON POLAR SHDR XLG (MISCELLANEOUS) ×1 IMPLANT
SET TUBE SUCT SHAVER OUTFL 24K (TUBING) ×3 IMPLANT
SET TUBE TIP INTRA-ARTICULAR (MISCELLANEOUS) ×3 IMPLANT
SLING ULTRA II M (MISCELLANEOUS) IMPLANT
SUT ETHILON 4-0 (SUTURE) ×3
SUT ETHILON 4-0 FS2 18XMFL BLK (SUTURE) ×1
SUT LASSO 90 DEG CVD (SUTURE) ×3 IMPLANT
SUT LASSO 90 DEG SD STR (SUTURE) IMPLANT
SUT MNCRL 4-0 (SUTURE)
SUT MNCRL 4-0 27XMFL (SUTURE)
SUTURE ETHLN 4-0 FS2 18XMF BLK (SUTURE) ×1 IMPLANT
SUTURE MNCRL 4-0 27XMF (SUTURE) IMPLANT
TAPE MICROFOAM 4IN (TAPE) ×3 IMPLANT
TAPE SUT LABRALTAP WHT/BLK (SUTURE) ×9 IMPLANT
TUBING ARTHRO INFLOW-ONLY STRL (TUBING) ×3 IMPLANT
TUBING CONNECTING 10 (TUBING) ×2 IMPLANT
TUBING CONNECTING 10' (TUBING) ×1
WAND WEREWOLF FLOW 90D (MISCELLANEOUS) ×3 IMPLANT
WRAPON POLAR PAD SHDR XLG (MISCELLANEOUS) ×3

## 2019-09-15 NOTE — Anesthesia Procedure Notes (Signed)
Procedure Name: Intubation Date/Time: 09/15/2019 10:19 AM Performed by: Armanda Heritage, CRNA Pre-anesthesia Checklist: Patient identified, Emergency Drugs available, Suction available, Patient being monitored and Timeout performed Patient Re-evaluated:Patient Re-evaluated prior to induction Oxygen Delivery Method: Circle system utilized Preoxygenation: Pre-oxygenation with 100% oxygen Induction Type: IV induction Ventilation: Mask ventilation without difficulty Laryngoscope Size: McGraph and 4 Grade View: Grade I Tube type: Oral Tube size: 6.5 mm Number of attempts: 1 Airway Equipment and Method: Stylet Placement Confirmation: ETT inserted through vocal cords under direct vision Secured at: 22 cm Dental Injury: Teeth and Oropharynx as per pre-operative assessment
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
## 2019-09-15 NOTE — Anesthesia Procedure Notes (Signed)
Performed by: Ajooni Karam M, CRNA       

## 2019-09-15 NOTE — Anesthesia Procedure Notes (Signed)
Anesthesia Regional Block: Interscalene brachial plexus block   Pre-Anesthetic Checklist: ,, timeout performed, Correct Patient, Correct Site, Correct Laterality, Correct Procedure, Correct Position, site marked, Risks and benefits discussed,  Surgical consent,  Pre-op evaluation,  At surgeon's request and post-op pain management  Laterality: Left  Prep: chloraprep       Needles:  Injection technique: Single-shot  Needle Type: Stimiplex     Needle Length: 10cm  Needle Gauge: 21     Additional Needles:   Procedures:,,,, ultrasound used (permanent image in chart),,,,  Narrative:  Start time: 09/15/2019 9:35 AM End time: 09/15/2019 9:40 AM Injection made incrementally with aspirations every 5 mL.  Performed by: Personally  Anesthesiologist: Alver Fisher, MD  Additional Notes: Functioning IV was confirmed and monitors were applied.  A Stimuplex needle was used. Sterile prep and drape,hand hygiene and sterile gloves were used.  Negative aspiration and negative test dose prior to incremental administration of local anesthetic. The patient tolerated the procedure well.

## 2019-09-15 NOTE — Transfer of Care (Signed)
Immediate Anesthesia Transfer of Care Note  Patient: Guinea  Procedure(s) Performed: Left shoulder arthroscopic bony Bankart repair, anterior labral repair and capsulorrhaphy with possible biceps tenodesis (Left Shoulder)  Patient Location: PACU  Anesthesia Type:General  Level of Consciousness: awake and sedated  Airway & Oxygen Therapy: Patient Spontanous Breathing and Patient connected to nasal cannula oxygen  Post-op Assessment: Report given to RN and Post -op Vital signs reviewed and stable  Post vital signs: Reviewed and stable  Last Vitals:  Vitals Value Taken Time  BP 108/66 09/15/19 1454  Temp    Pulse 97 09/15/19 1454  Resp 18 09/15/19 1454  SpO2 97 % 09/15/19 1454  Vitals shown include unvalidated device data.  Last Pain:  Vitals:   09/15/19 0841  TempSrc: Temporal  PainSc: 1          Complications: No complications documented.

## 2019-09-15 NOTE — OR Nursing (Signed)
Instructed with use of incentive spirometer with return demonstration. 

## 2019-09-15 NOTE — Discharge Instructions (Signed)
Post-Op Instructions  1. Bracing: You will wear a shoulder immobilizer or sling for at least 3 weeks. Total time will be determined by type of surgical repair and can be discussed at 1st postop appointment.  2. Driving: No driving for 3 weeks post-op. When driving, do not wear the immobilizer.  3. Activity: No active lifting for 2 months. Wrist, hand, and elbow motion only. Avoid lifting the upper arm away from the body except for hygiene. You are permitted to bend and straighten the elbow actively. You may use your hand and wrist for typing, writing, and managing utensils (cutting food). Do not lift more than a coffee cup for 8 weeks.  When sleeping or resting, inclined positions (recliner chair or wedge pillow) and a pillow under the forearm for support may provide better comfort for up to 4 weeks.  Avoid long distance travel for 4 weeks.  Return to normal activities after labral repair normally takes 6 months on average. If rehab goes very well, may be able to do most activities at 4 months, except overhead or contact sports.  4. Physical Therapy: Begins 3-4 days after surgery, and proceeds 2 times per week for the first 4 weeks, then 1-2 times per week from weeks 4-8 post-op.  5. Medications:  - You will be provided a prescription for narcotic pain medicine. After surgery, take 1-2 narcotic tablets every 4 hours if needed for severe pain.  - A prescription for anti-nausea medication will be provided in case the narcotic medicine causes nausea - take 1 tablet every 6 hours only if nauseated.   - Take tylenol 1000 mg every 8 hours for pain.  May stop tylenol 3 days after surgery if you are having minimal pain.  If you are taking prescription medication for anxiety, depression, insomnia, muscle spasm, chronic pain, or for attention deficit disorder, you are advised that you are at a higher risk of adverse effects with use of narcotics post-op, including narcotic addiction/dependence, depressed  breathing, death. If you use non-prescribed substances: alcohol, marijuana, cocaine, heroin, methamphetamines, etc., you are at a higher risk of adverse effects with use of narcotics post-op, including narcotic addiction/dependence, depressed breathing, death. You are advised that taking > 50 morphine milligram equivalents (MME) of narcotic pain medication per day results in twice the risk of overdose or death. For your prescription provided: oxycodone 5 mg - taking more than 6 tablets per day would result in > 50 morphine milligram equivalents (MME) of narcotic pain medication. Be advised that we will prescribe narcotics short-term, for acute post-operative pain only - 3 weeks for major operations such as shoulder repair/reconstruction surgeries.   6. Post-Op Appointment:  Your first post-op appointment will be 10-14 days post-op.  7. Work or School: For most, but not all procedures, we advise staying out of work or school for at least 1 to 2 weeks in order to recover from the stress of surgery and to allow time for healing.   If you need a work or school note this can be provided.   Post-operative Brace: Apply and remove the brace you received as you were instructed to at the time of fitting and as described in detail as the brace's instructions for use indicate.  Wear the brace for the period of time prescribed by your physician.  The brace can be cleaned with soap and water and allowed to air dry only.  Should the brace result in increased pain, decreased feeling (numbness/tingling), increased swelling or an overall worsening  of your medical condition, please contact your doctor immediately.  If an emergency situation occurs as a result of wearing the brace after normal business hours, please dial 911 and seek immediate medical attention.  Let your doctor know if you have any further questions about the brace issued to you. Refer to the shoulder sling instructions for use if you have any questions  regarding the correct fit of your shoulder sling.  [facility] Customer Care for Troubleshooting: [phone]  Video that illustrates how to properly use a shoulder sling: "Instructions for Proper Use of an Orthopaedic Sling" http://bass.com/            Interscalene Nerve Block with Exparel  1.  For your surgery you have received an Interscalene Nerve Block with Exparel. 2. Nerve Blocks affect many types of nerves, including nerves that control movement, pain and normal sensation.  You may experience feelings such as numbness, tingling, heaviness, weakness or the inability to move your arm or the feeling or sensation that your arm has "fallen asleep". 3. A nerve block with Exparel can last up to 5 days.  Usually the weakness wears off first.  The tingling and heaviness usually wear off next.  Finally you may start to notice pain.  Keep in mind that this may occur in any order.  Once a nerve block starts to wear off it is usually completely gone within 60 minutes. 4. ISNB may cause mild shortness of breath, a hoarse voice, blurry vision, unequal pupils, or drooping of the face on the same side as the nerve block.  These symptoms will usually resolve with the numbness.  Very rarely the procedure itself can cause mild seizures. 5. If needed, your surgeon will give you a prescription for pain medication.  It will take about 60 minutes for the oral pain medication to become fully effective.  So, it is recommended that you start taking this medication before the nerve block first begins to wear off, or when you first begin to feel discomfort. 6. Take your pain medication only as prescribed.  Pain medication can cause sedation and decrease your breathing if you take more than you need for the level of pain that you have. 7. Nausea is a common side effect of many pain medications.  You may want to eat something before taking your pain medicine to prevent nausea. 8. After an  Interscalene nerve block, you cannot feel pain, pressure or extremes in temperature in the effected arm.  Because your arm is numb it is at an increased risk for injury.  To decrease the possibility of injury, please practice the following:  a. While you are awake change the position of your arm frequently to prevent too much pressure on any one area for prolonged periods of time. b.  If you have a cast or tight dressing, check the color or your fingers every couple of hours.  Call your surgeon with the appearance of any discoloration (white or blue). c. If you are given a sling to wear before you go home, please wear it  at all times until the block has completely worn off.  Do not get up at night without your sling. d. Please contact [facility] or your surgeon if you do not begin to regain sensation after 7 days from the surgery.  Anesthesia may be contacted by calling the Same Day Surgery Department, Mon. through Fri., 6 am to 4 pm at [phone].   e. If you experience any other problems or  concerns, please contact your surgeon's office. f. If you experience severe or prolonged shortness of breath go to the nearest emergency department.   AMBULATORY SURGERY  DISCHARGE INSTRUCTIONS   1) The drugs that you were given will stay in your system until tomorrow so for the next 24 hours you should not:  A) Drive an automobile B) Make any legal decisions C) Drink any alcoholic beverage   2) You may resume regular meals tomorrow.  Today it is better to start with liquids and gradually work up to solid foods.  You may eat anything you prefer, but it is better to start with liquids, then soup and crackers, and gradually work up to solid foods.   3) Please notify your doctor immediately if you have any unusual bleeding, trouble breathing, redness and pain at the surgery site, drainage, fever, or pain not relieved by medication.    4) Additional Instructions:    Please contact your  physician with any problems or Same Day Surgery at [phone], Monday through Friday 6 am to 4 pm, or [place] at [facility] number at [phone].

## 2019-09-15 NOTE — Anesthesia Preprocedure Evaluation (Signed)
Anesthesia Evaluation  Patient identified by MRN, date of birth, ID band Patient awake    Reviewed: Allergy & Precautions, NPO status , Patient's Chart, lab work & pertinent test results  History of Anesthesia Complications Negative for: history of anesthetic complications  Airway Mallampati: III  TM Distance: >3 FB Neck ROM: Full    Dental no notable dental hx.    Pulmonary neg pulmonary ROS, neg sleep apnea, neg COPD,    breath sounds clear to auscultation- rhonchi (-) wheezing      Cardiovascular Exercise Tolerance: Good (-) hypertension(-) CAD, (-) Past MI, (-) Cardiac Stents and (-) CABG  Rhythm:Regular Rate:Normal - Systolic murmurs and - Diastolic murmurs    Neuro/Psych  Headaches, neg Seizures negative psych ROS   GI/Hepatic negative GI ROS, Neg liver ROS,   Endo/Other  negative endocrine ROSneg diabetes  Renal/GU negative Renal ROS     Musculoskeletal negative musculoskeletal ROS (+)   Abdominal (+) + obese,   Peds  Hematology negative hematology ROS (+)   Anesthesia Other Findings Past Medical History: No date: Headache     Comment:  H/O MIGRIANES AS A TEENAGER   Reproductive/Obstetrics                             Anesthesia Physical Anesthesia Plan  ASA: II  Anesthesia Plan: General   Post-op Pain Management:  Regional for Post-op pain   Induction: Intravenous  PONV Risk Score and Plan: 2 and Ondansetron, Dexamethasone and Midazolam  Airway Management Planned: Oral ETT  Additional Equipment:   Intra-op Plan:   Post-operative Plan: Extubation in OR  Informed Consent: I have reviewed the patients History and Physical, chart, labs and discussed the procedure including the risks, benefits and alternatives for the proposed anesthesia with the patient or authorized representative who has indicated his/her understanding and acceptance.     Dental advisory  given  Plan Discussed with: CRNA and Anesthesiologist  Anesthesia Plan Comments:         Anesthesia Quick Evaluation

## 2019-09-15 NOTE — H&P (Signed)
Paper H&P to be scanned into permanent record. H&P reviewed. No significant changes noted.  

## 2019-09-16 NOTE — Progress Notes (Signed)
Patient reports a >2 inch area of bleeding that happened overnight; patient states "im really unsure bc it is hard to see how much bleeding from the back".  She reports it is not actively bleeding at this time.  However, I encouraged her to call her surgerons office to report bleeding.  Patient with verbal understanding. Denies pain.

## 2019-09-16 NOTE — Anesthesia Postprocedure Evaluation (Signed)
Anesthesia Post Note  Patient: Chelsea Richardson  Procedure(s) Performed: Left shoulder arthroscopic bony Bankart repair, anterior labral repair and capsulorrhaphy with possible biceps tenodesis (Left Shoulder)  Patient location during evaluation: PACU Anesthesia Type: General Level of consciousness: awake and alert Pain management: pain level controlled Vital Signs Assessment: post-procedure vital signs reviewed and stable Respiratory status: spontaneous breathing, nonlabored ventilation, respiratory function stable and patient connected to nasal cannula oxygen Cardiovascular status: blood pressure returned to baseline and stable Postop Assessment: no apparent nausea or vomiting Anesthetic complications: no   No complications documented.   Last Vitals:  Vitals:   09/15/19 1611 09/15/19 1705  BP: 129/75 123/63  Pulse: 97 92  Resp: (!) 21 18  Temp:  (!) 36.4 C  SpO2: 97% 95%    Last Pain:  Vitals:   09/15/19 1705  TempSrc: Tympanic  PainSc: 0-No pain                 Cleda Mccreedy Chelsea Richardson

## 2020-01-21 ENCOUNTER — Other Ambulatory Visit: Payer: Self-pay

## 2020-01-21 ENCOUNTER — Ambulatory Visit
Admission: EM | Admit: 2020-01-21 | Discharge: 2020-01-21 | Disposition: A | Discharge status: Home / Self Care | Payer: 59 | Attending: Sports Medicine | Admitting: Sports Medicine

## 2020-01-21 ENCOUNTER — Encounter: Payer: Self-pay | Admitting: Emergency Medicine

## 2020-01-21 DIAGNOSIS — U071 COVID-19: Secondary | ICD-10-CM | POA: Diagnosis not present

## 2020-01-21 DIAGNOSIS — R509 Fever, unspecified: Secondary | ICD-10-CM | POA: Diagnosis present

## 2020-01-21 DIAGNOSIS — M791 Myalgia, unspecified site: Secondary | ICD-10-CM

## 2020-01-21 DIAGNOSIS — J029 Acute pharyngitis, unspecified: Secondary | ICD-10-CM | POA: Diagnosis present

## 2020-01-21 DIAGNOSIS — J069 Acute upper respiratory infection, unspecified: Secondary | ICD-10-CM | POA: Diagnosis not present

## 2020-01-21 LAB — GROUP A STREP BY PCR: Group A Strep by PCR: NOT DETECTED

## 2020-01-21 LAB — SARS CORONAVIRUS 2 (TAT 6-24 HRS): SARS Coronavirus 2: POSITIVE — AB

## 2020-01-21 NOTE — ED Provider Notes (Signed)
[facility]    CSN: [id number] Arrival date & time: 01/21/20  0835      History   Chief Complaint Chief Complaint  Patient presents with  . Sore Throat  . Cough  . Nasal Congestion    HPI Chelsea Richardson is a 30 y.o. female.   A pleasant 30 year old female who presents for evaluation of a sore throat and fever for about 3 to 4 days.  She is also had a dry nonproductive cough.  She also has associated body chills and aches.  No exposure to Covid that she knows of.  No history of Covid.  Denies nausea vomiting diarrhea.  Again she does have fever but no shakes or chills.  No abdominal pain or urinary symptoms.  She has been vaccinated against Covid x2 but no booster.  No flu vaccine.  Denies chest pain or shortness of breath.  She stays at home and does not work outside the home.  No red flag signs or symptoms.     Past Medical History:  Diagnosis Date  . 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 Chronic cholecystitis 06/05/2016  . Calculus of gallbladder without cholecystitis without obstruction 01/27/2016    Past Surgical History:  Procedure Laterality Date  . CHOLECYSTECTOMY N/A 06/05/2016   Procedure: LAPAROSCOPIC CHOLECYSTECTOMY;  Surgeon: Olean Ree, MD;  Location: [facility];  Service: General;  Laterality: N/A;  . SHOULDER ARTHROSCOPY WITH LABRAL REPAIR Left 09/15/2019   Procedure: Left shoulder arthroscopic bony Bankart repair, anterior labral repair and capsulorrhaphy with possible biceps tenodesis;  Surgeon: Leim Fabry, MD;  Location: [facility];  Service: Orthopedics;  Laterality: Left;  . WISDOM TOOTH EXTRACTION      OB History   No obstetric history on file.      Home Medications    Prior to Admission medications   Medication Sig Start Date End Date Taking? Authorizing Provider  acetaminophen (TYLENOL) 500 MG tablet Take 2 tablets (1,000 mg total) by mouth every 8 (eight) hours.  09/15/19 09/14/20 Yes Leim Fabry, MD  ELDERBERRY PO Take 150 mg by mouth daily at 12 noon. Gummie   Yes [provider]  ibuprofen (ADVIL,MOTRIN) 200 MG tablet Take 400 mg by mouth daily as needed for mild pain or moderate pain.    Yes [provider]  ondansetron (ZOFRAN ODT) 4 MG disintegrating tablet Take 1 tablet (4 mg total) by mouth every 8 (eight) hours as needed for nausea or vomiting. 09/15/19   Leim Fabry, MD  OVER THE COUNTER MEDICATION Take 1 tablet by mouth daily at 12 noon. immunity booster support sugar-free    [provider]  oxyCODONE (ROXICODONE) 5 MG immediate release tablet Take 1-2 tablets (5-10 mg total) by mouth every 4 (four) hours as needed (pain). 09/15/19 09/14/20  Leim Fabry, MD    Family History Family History  Problem Relation Age of Onset  . Healthy Mother   . Healthy Father   . Cancer Maternal Grandmother 67       breast    Social History Social History   Tobacco Use  . Smoking status: Never Smoker  . Smokeless tobacco: Never Used  Vaping Use  . Vaping Use: Never used  Substance Use Topics  . Alcohol use: Yes    Comment: OCC   . Drug use: No     Allergies   Patient has no known allergies.   Review  of Systems Review of Systems  Constitutional: Positive for fever. Negative for activity change, appetite change, chills, diaphoresis and fatigue.  HENT: Positive for sore throat. Negative for congestion, ear pain, sinus pressure and sinus pain.   Eyes: Negative for pain.  Respiratory: Positive for cough. Negative for chest tightness, shortness of breath, wheezing and stridor.   Cardiovascular: Negative for chest pain.  Gastrointestinal: Negative for abdominal pain, diarrhea, nausea and vomiting.  Genitourinary: Negative for dysuria.  Musculoskeletal: Positive for myalgias.  Skin: Negative for color change, pallor, rash and wound.  Neurological: Negative for dizziness, weakness, light-headedness and headaches.  All  other systems reviewed and are negative.    Physical Exam Triage Vital Signs ED Triage Vitals [01/21/20 0842]  Enc Vitals Group     BP 124/78     Pulse Rate 82     Resp 18     Temp 99 F (37.2 C)     Temp Source Oral     SpO2 98 %     Weight 230 lb (104.3 kg)     Height 5\' 2"  (1.575 m)     Head Circumference      Peak Flow      Pain Score 8     Pain Loc      Pain Edu?      Excl. in GC?    No data found.  Updated Vital Signs BP 124/78 (BP Location: Right Arm)   Pulse 82   Temp 99 F (37.2 C) (Oral)   Resp 18   Ht 5\' 2"  (1.575 m)   Wt 104.3 kg   SpO2 98%   BMI 42.07 kg/m   Visual Acuity Right Eye Distance:   Left Eye Distance:   Bilateral Distance:    Right Eye Near:   Left Eye Near:    Bilateral Near:     Physical Exam Vitals and nursing note reviewed.  Constitutional:      General: She is not in acute distress.    Appearance: She is well-developed. She is not ill-appearing or toxic-appearing.  HENT:     Head: Normocephalic and atraumatic.     Right Ear: Tympanic membrane normal.     Left Ear: Tympanic membrane normal.     Nose: No congestion or rhinorrhea.     Mouth/Throat:     Mouth: Mucous membranes are moist.     Pharynx: Uvula midline. No oropharyngeal exudate, posterior oropharyngeal erythema or uvula swelling.     Tonsils: No tonsillar exudate or tonsillar abscesses. 1+ on the right. 1+ on the left.  Eyes:     Conjunctiva/sclera: Conjunctivae normal.     Pupils: Pupils are equal, round, and reactive to light.  Cardiovascular:     Rate and Rhythm: Normal rate and regular rhythm.     Heart sounds: Normal heart sounds. No murmur heard. No friction rub. No gallop.   Pulmonary:     Effort: Pulmonary effort is normal. No respiratory distress.     Breath sounds: Normal breath sounds. No stridor. No wheezing, rhonchi or rales.  Musculoskeletal:     Cervical back: Normal range of motion and neck supple.  Lymphadenopathy:     Cervical: Cervical  adenopathy present.  Skin:    General: Skin is warm and dry.     Capillary Refill: Capillary refill takes less than 2 seconds.  Neurological:     General: No focal deficit present.     Mental Status: She is alert and oriented to person, place, and  time.      UC Treatments / Results  Labs (all labs ordered are listed, but only abnormal results are displayed) Labs Reviewed  GROUP A STREP BY PCR  SARS CORONAVIRUS 2 (TAT 6-24 HRS)    EKG   Radiology No results found.  Procedures Procedures (including critical care time)  Medications Ordered in UC Medications - No data to display  Initial Impression / Assessment and Plan / UC Course  I have reviewed the triage vital signs and the nursing notes.  Pertinent labs & imaging results that were available during my care of the patient were reviewed by me and considered in my medical decision making (see chart for details).  Clinical impression pleasant 29 year old female with 3 to 4 days of sore throat and fever with body aches.  She is also complaining of a dry nonproductive cough.  Treatment plan: 1.  The findings and treatment plan were discussed in detail with the patient.  Patient was in agreement. 2.  Recommended we go ahead and do a strep test.  It was negative. 3.  Discussed doing a Covid test.  She agreed.  He did not have any rapid test so we will send it off to the hospital.  Advised her it could take 24 to 48 hours to come back and that she needs to assume that she is positive until she gets a negative test.  She needs to quarantine.  She does not work outside  the home so there is no need to give her a work note. 4. Gave her an Production manager. #5 supportive care, over-the-counter meds as needed, Tylenol Motrin for fever discomfort.  Just follow current CDC guidelines in terms of quarantine.  Await your test results. 6.  If you develop any significant shortness of breath or chest pain or any other worsening symptoms  she should call 911 or go directly to the ER.  Otherwise follow-up here as needed.      Final Clinical Impressions(s) / UC Diagnoses   Final diagnoses:  Viral URI with cough  Sore throat  Fever, unspecified  Myalgia     Discharge Instructions     Recommended we go ahead and do a strep test.  It was negative. Discussed doing a Covid test.  She agreed.  He did not have any rapid test so we will send it off to the hospital.  Advised her it could take 24 to 48 hours to come back and that she needs to assume that she is positive until she gets a negative test.  She needs to quarantine.  She does not work outside  the home so there is no need to give her a work note.  Someone from our office will contact her only if the test is positive.  She should check my chart for her results. Gave her an Production manager. supportive care, over-the-counter meds as needed, Tylenol Motrin for fever discomfort.  Just follow current CDC guidelines in terms of quarantine.  Await your test results. If you develop any significant shortness of breath or chest pain or any other worsening symptoms she should call 911 or go directly to the ER.  Otherwise follow-up here as needed.    ED Prescriptions    None     PDMP not reviewed this encounter.   Delton See, MD 01/21/20 [phone]

## 2020-01-21 NOTE — ED Triage Notes (Addendum)
Patient c/o fever Friday/Saturday with body aches. She states she also has nasal congestion, cough and sore throat that started Sunday. Patient declines COVID testing, she states her husband is being swabbed today for COVID

## 2020-01-21 NOTE — Discharge Instructions (Addendum)
Recommended we go ahead and do a strep test.  It was negative. Discussed doing a Covid test.  She agreed.  He did not have any rapid test so we will send it off to the hospital.  Advised her it could take 24 to 48 hours to come back and that she needs to assume that she is positive until she gets a negative test.  She needs to quarantine.  She does not work outside  the home so there is no need to give her a work note.  Someone from our office will contact her only if the test is positive.  She should check my chart for her results. Gave her an Production manager. supportive care, over-the-counter meds as needed, Tylenol Motrin for fever discomfort.  Just follow current CDC guidelines in terms of quarantine.  Await your test results. If you develop any significant shortness of breath or chest pain or any other worsening symptoms she should call 911 or go directly to the ER.  Otherwise follow-up here as needed.

## 2021-11-17 IMAGING — CT CT SHOULDER*L* W/O CM
1 series · 12 of 14 positions shown, 15 images · non-contrast
Comparison: Plain films left shoulder 08/02/2019.

CLINICAL DATA: The patient suffered a left shoulder dislocation in
a fall 08/02/2019. Pain since the incident. Subsequent encounter.

EXAM:
CT OF THE UPPER LEFT EXTREMITY WITHOUT CONTRAST
TECHNIQUE: Multidetector CT imaging of the upper left extremity was performed
according to the standard protocol.

[Series 5: ax st · axial · 0.47mm/px · z∈[-606,-458]mm · 12 of 94 slices shown, 15 images]
[im 8/94  soft-tissue]
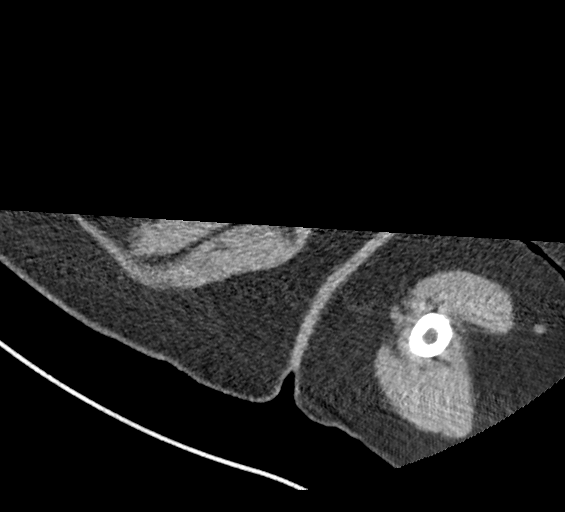
[im 8/94  bone]
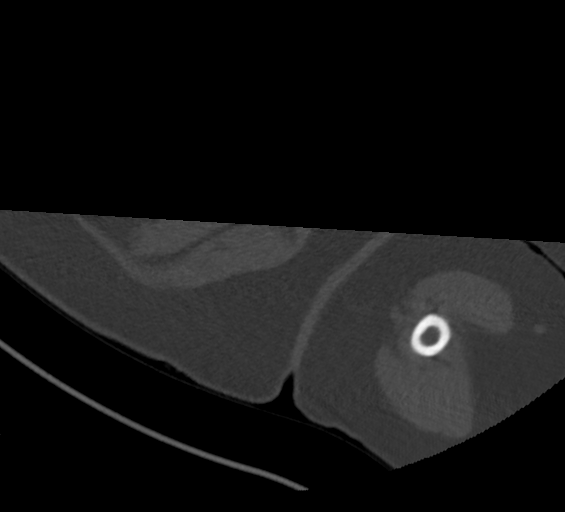
[im 15/94  bone]
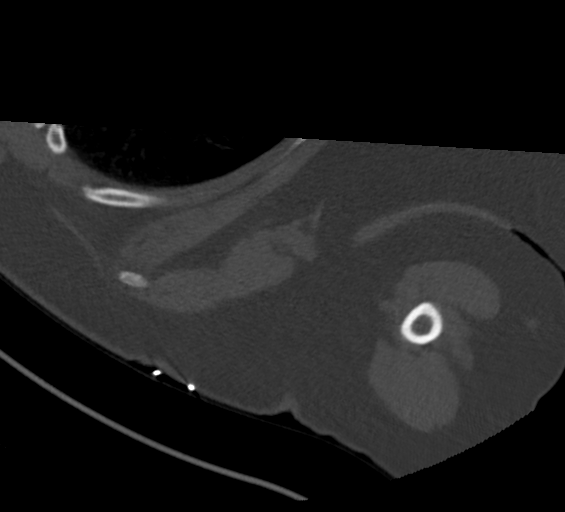
[im 22/94  bone]
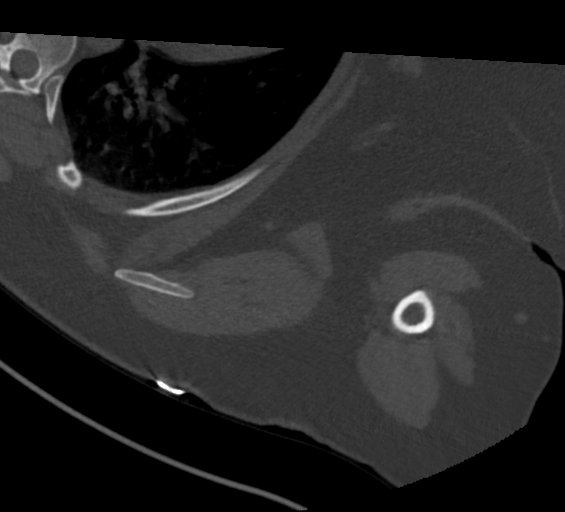
[im 29/94  bone]
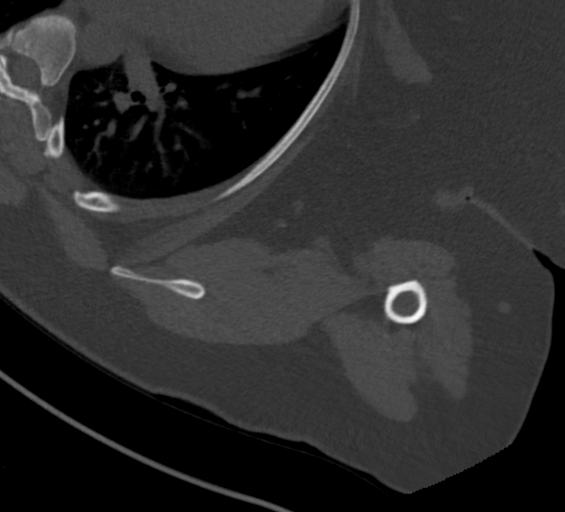
[im 36/94  soft-tissue]
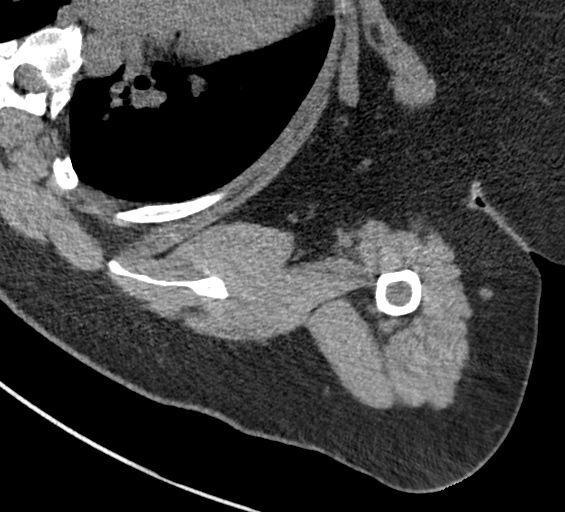
[im 36/94  bone]
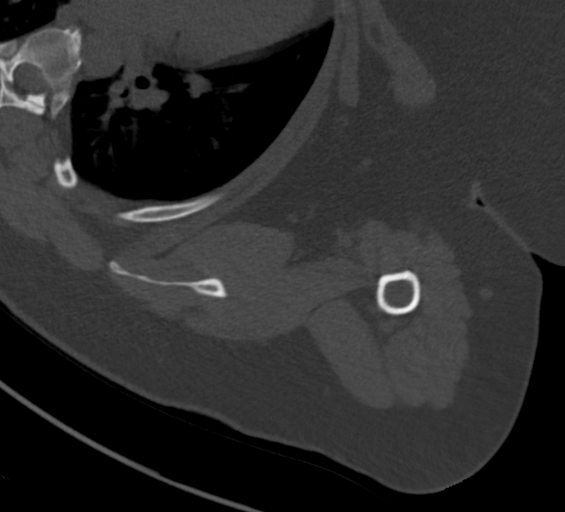
[im 43/94  bone]
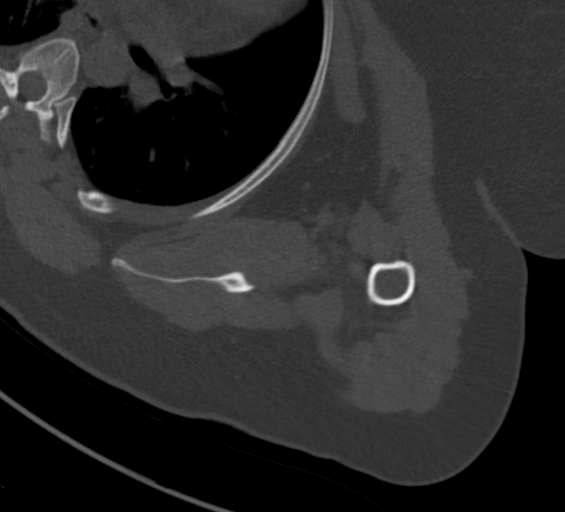
[im 51/94  bone]
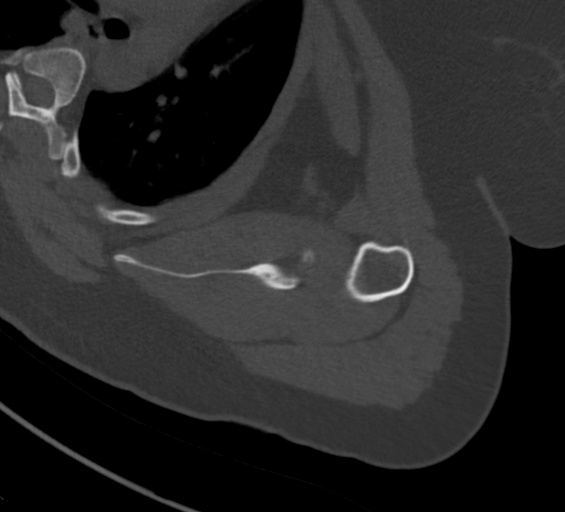
[im 58/94  bone]
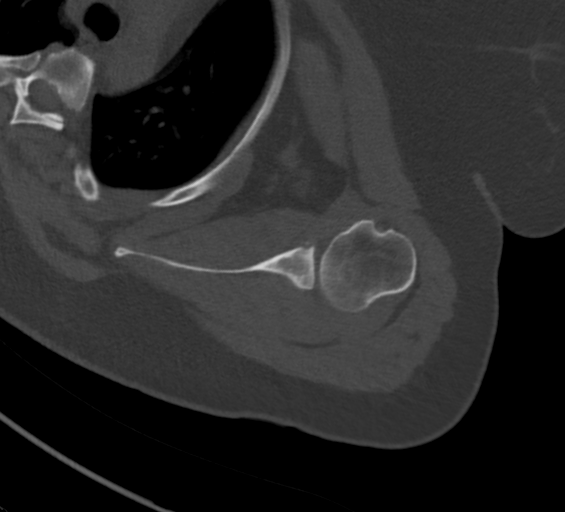
[im 65/94  soft-tissue]
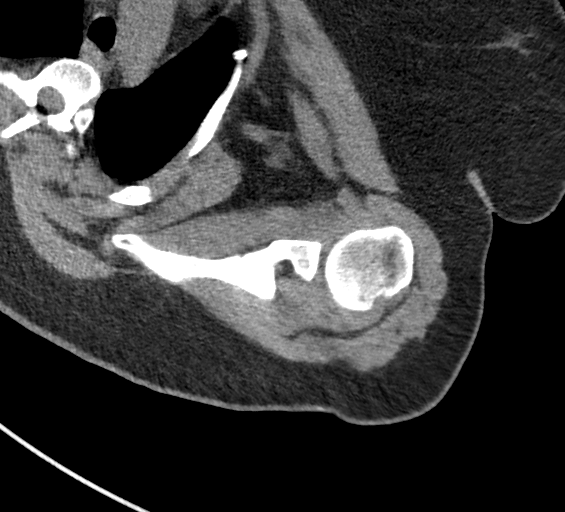
[im 65/94  bone]
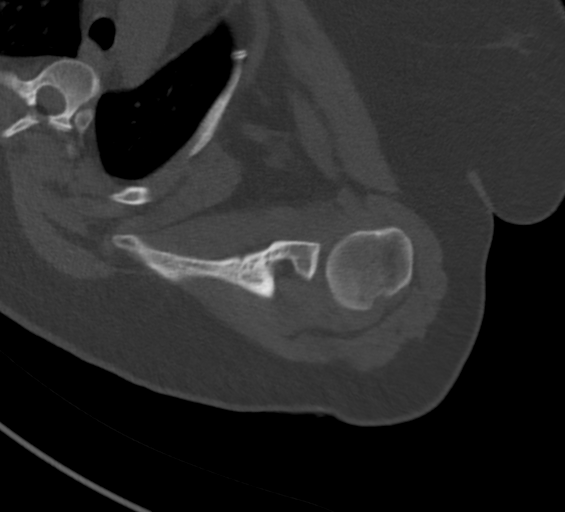
[im 72/94  bone]
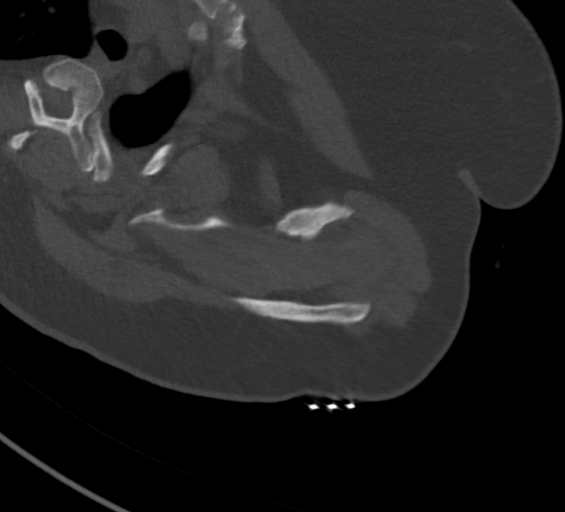
[im 79/94  bone]
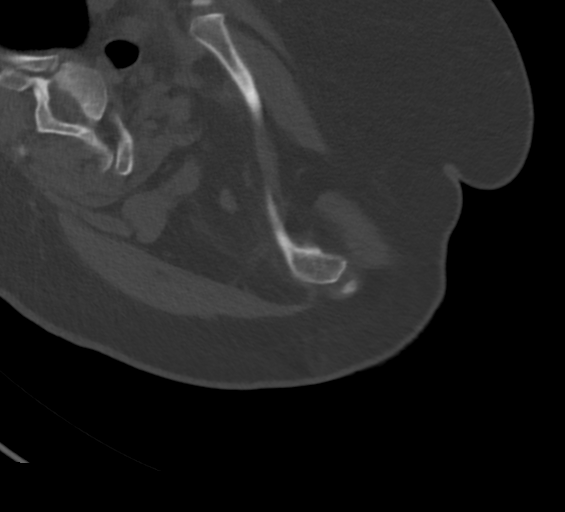
[im 86/94  bone]
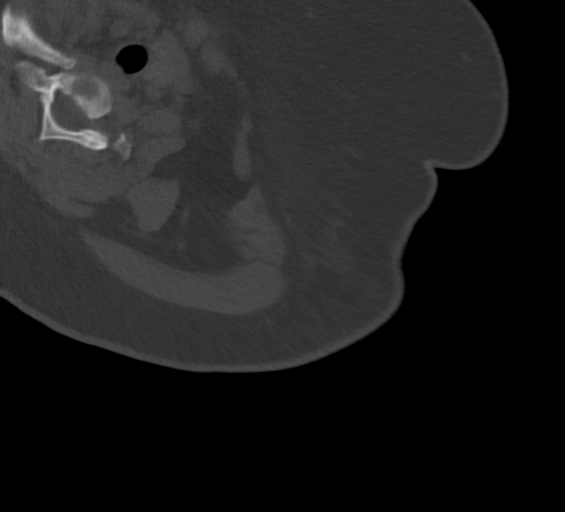

[12 of 14 positions shown; findings below may reference images not displayed]

FINDINGS: Bones/Joint/Cartilage

The patient has a Hill-Sachs lesion with a bone fragment off the
anterior, inferior glenoid measuring 1.5 cm craniocaudal x 0.4 cm AP
x 0.5 cm transverse identified. The fragment demonstrates mild
anterior displacement and medial displacement of 0.3 cm resulting in
a small step-off at the articular surface of the anterior, inferior
glenoid. Also seen is a small to moderate Hill-Sachs deformity.

The humeral head is located and normally positioned on the glenoid.
The acromioclavicular joint is intact. No lytic or sclerotic lesion
is identified.

Ligaments

Suboptimally assessed by CT.

Muscles and Tendons

The rotator cuff appears intact and normal. See report of dedicated
shoulder MRI this same day.

Soft tissues

Imaged lung parenchyma is clear.
IMPRESSION: Bony Bankart and a small to moderate Hill-Sachs lesion as described
above consistent with history of shoulder dislocation.

3-dimensional CT images were rendered by post-processing of the
original CT data at the CT scanner. The 3-dimensional CT images were
interpreted, and findings were reported in the accompanying complete
CT report for this study.

## 2021-11-17 IMAGING — CT CT 3D ACQUISTION [facility]
1 series · 1 of 1 positions shown · non-contrast
Comparison: Plain films left shoulder 08/02/2019.

CLINICAL DATA: The patient suffered a left shoulder dislocation in
a fall 08/02/2019. Pain since the incident. Subsequent encounter.

EXAM:
CT OF THE UPPER LEFT EXTREMITY WITHOUT CONTRAST
TECHNIQUE: Multidetector CT imaging of the upper left extremity was performed
according to the standard protocol.

[Series 1: topogram 0.6 t20f · coronal · 1.00mm/px · 1 of 1 slices shown]
[im 1/1]
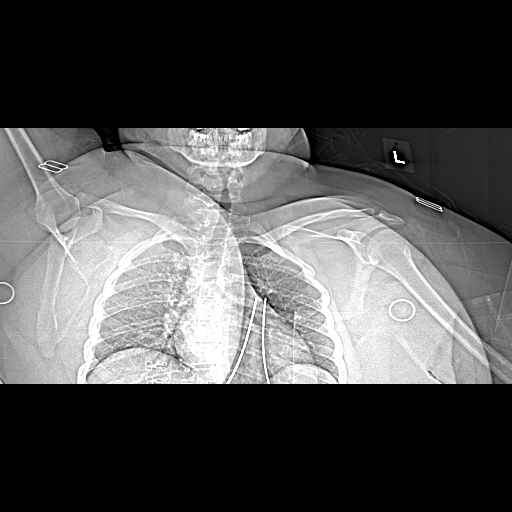

[1 of 1 positions shown; findings below may reference images not displayed]

FINDINGS: Bones/Joint/Cartilage

The patient has a Hill-Sachs lesion with a bone fragment off the
anterior, inferior glenoid measuring 1.5 cm craniocaudal x 0.4 cm AP
x 0.5 cm transverse identified. The fragment demonstrates mild
anterior displacement and medial displacement of 0.3 cm resulting in
a small step-off at the articular surface of the anterior, inferior
glenoid. Also seen is a small to moderate Hill-Sachs deformity.

The humeral head is located and normally positioned on the glenoid.
The acromioclavicular joint is intact. No lytic or sclerotic lesion
is identified.

Ligaments

Suboptimally assessed by CT.

Muscles and Tendons

The rotator cuff appears intact and normal. See report of dedicated
shoulder MRI this same day.

Soft tissues

Imaged lung parenchyma is clear.
IMPRESSION: Bony Bankart and a small to moderate Hill-Sachs lesion as described
above consistent with history of shoulder dislocation.

3-dimensional CT images were rendered by post-processing of the
original CT data at the CT scanner. The 3-dimensional CT images were
interpreted, and findings were reported in the accompanying complete
CT report for this study.

## 2023-11-19 ENCOUNTER — Ambulatory Visit: Payer: Self-pay | Admitting: Internal Medicine

## 2024-02-05 ENCOUNTER — Ambulatory Visit: Admitting: Internal Medicine
